# Patient Record
Sex: Female | Born: 1975 | Race: White | Hispanic: No | Marital: Married | Smoking: Former smoker
Health system: Southern US, Community
[De-identification: ages and names within clinical notes are randomized; demographics above are authoritative.]

## PROBLEM LIST (undated history)

## (undated) DIAGNOSIS — K219 Gastro-esophageal reflux disease without esophagitis: Secondary | ICD-10-CM

## (undated) DIAGNOSIS — T7840XA Allergy, unspecified, initial encounter: Secondary | ICD-10-CM

## (undated) HISTORY — DX: Allergy, unspecified, initial encounter: T78.40XA

---

## 2018-04-20 ENCOUNTER — Encounter: Payer: Self-pay | Admitting: Family Medicine

## 2018-04-20 ENCOUNTER — Ambulatory Visit (INDEPENDENT_AMBULATORY_CARE_PROVIDER_SITE_OTHER): Payer: BLUE CROSS/BLUE SHIELD | Admitting: Family Medicine

## 2018-04-20 VITALS — BP 115/79 | HR 83 | Temp 98.3°F | Ht 65.2 in | Wt 204.2 lb

## 2018-04-20 DIAGNOSIS — M79672 Pain in left foot: Secondary | ICD-10-CM | POA: Diagnosis not present

## 2018-04-20 MED ORDER — PREDNISONE 50 MG PO TABS: 50.0000 mg | ORAL_TABLET | Freq: Every day | ORAL | 0 refills | Status: DC

## 2018-04-20 NOTE — Patient Instructions (Signed)

## 2018-04-20 NOTE — Progress Notes (Signed)
BP 115/79 (BP Location: Left Arm, Patient Position: Sitting, Cuff Size: Large)   Pulse 83   Temp 98.3 F (36.8 C)   Ht 5' 5.2" (1.656 m)   Wt 204 lb 4 oz (92.6 kg)   LMP 03/21/2018 (Approximate)   SpO2 98%   BMI 33.78 kg/m    Subjective:    Patient ID: Candace Reed, female    DOB: May 03, 1976, 42 y.o.   MRN: [id number]  HPI: Candace Reed is a 42 y.o. female who presents today to establish care  Chief Complaint  Patient presents with  . Establish Care  . Foot Pain    left, difficulity walking   FOOT PAIN- pain in her arch, has been going on for years, but has gotten worse in the past few months. Just getting worse.  Duration: months Involved foot: left Mechanism of injury: unknown Location: middle of the arch- inside of her foot Onset: gradual  Severity: moderate  Quality:  Aching with occasional sharp Frequency: constant Radiation: no Aggravating factors: weight bearing and walking  Alleviating factors: ice, physical therapy, HEP, APAP, NSAIDs and rest  Status: worse Treatments attempted: rest, ice, heat, APAP, ibuprofen and aleve  Relief with NSAIDs?:  mild Weakness with weight bearing or walking: no Morning stiffness: yes Swelling: yes Redness: no Bruising: no Paresthesias / decreased sensation: no  Fevers:no   Active Ambulatory Problems    Diagnosis Date Noted  . No Active Ambulatory Problems   Resolved Ambulatory Problems    Diagnosis Date Noted  . No Resolved Ambulatory Problems   Past Medical History:  Diagnosis Date  . Allergy    History reviewed. No pertinent surgical history. Outpatient Encounter Medications as of 04/20/2018  Medication Sig  . predniSONE (DELTASONE) 50 MG tablet Take 1 tablet (50 mg total) by mouth daily with breakfast.   No facility-administered encounter medications on file as of 04/20/2018.    No Known Allergies Social History   Socioeconomic History  . Marital status: Married    Spouse name: Not on file  .  Number of children: Not on file  . Years of education: Not on file  . Highest education level: Not on file  Occupational History  . Not on file  Social Needs  . Financial resource strain: Not on file  . Food insecurity:    Worry: Not on file    Inability: Not on file  . Transportation needs:    Medical: Not on file    Non-medical: Not on file  Tobacco Use  . Smoking status: Former Smoker    Last attempt to quit: 05/19/2003    Years since quitting: 14.9  . Smokeless tobacco: Never Used  Substance and Sexual Activity  . Alcohol use: Yes    Alcohol/week: 4.0 standard drinks    Types: 2 Glasses of wine, 2 Cans of beer per week  . Drug use: Never  . Sexual activity: Yes    Birth control/protection: None  Lifestyle  . Physical activity:    Days per week: Not on file    Minutes per session: Not on file  . Stress: Not on file  Relationships  . Social connections:    Talks on phone: Not on file    Gets together: Not on file    Attends religious service: Not on file    Active member of club or organization: Not on file    Attends meetings of clubs or organizations: Not on file    Relationship status: Not on file  Other Topics Concern  . Not on file  Social History Narrative  . Not on file   Family History  Problem Relation Age of Onset  . Cancer Mother        Thyroid  . Allergies Son   . Birth defects Maternal Grandmother   . Birth defects Maternal Grandfather     Review of Systems  Constitutional: Negative.   Respiratory: Negative.   Cardiovascular: Negative.   Musculoskeletal: Positive for myalgias. Negative for arthralgias, back pain, gait problem, joint swelling, neck pain and neck stiffness.  Skin: Negative.   Neurological: Negative for dizziness, tremors, seizures, syncope, facial asymmetry, speech difficulty, weakness, light-headedness, numbness and headaches.  Hematological: Negative.   Psychiatric/Behavioral: Negative.     Per HPI unless specifically  indicated above     Objective:    BP 115/79 (BP Location: Left Arm, Patient Position: Sitting, Cuff Size: Large)   Pulse 83   Temp 98.3 F (36.8 C)   Ht 5' 5.2" (1.656 m)   Wt 204 lb 4 oz (92.6 kg)   LMP 03/21/2018 (Approximate)   SpO2 98%   BMI 33.78 kg/m   Wt Readings from Last 3 Encounters:  04/20/18 204 lb 4 oz (92.6 kg)    Physical Exam  Constitutional: She is oriented to person, place, and time. She appears well-developed and well-nourished. No distress.  HENT:  Head: Normocephalic and atraumatic.  Right Ear: Hearing normal.  Left Ear: Hearing normal.  Nose: Nose normal.  Eyes: Conjunctivae and lids are normal. Right eye exhibits no discharge. Left eye exhibits no discharge. No scleral icterus.  Cardiovascular: Normal rate, regular rhythm, normal heart sounds and intact distal pulses. Exam reveals no gallop and no friction rub.  No murmur heard. Pulmonary/Chest: Effort normal and breath sounds normal. No stridor. No respiratory distress. She has no wheezes. She has no rales. She exhibits no tenderness.  Musculoskeletal: Normal range of motion.  Tenderness over the medial arch with some swelling   Neurological: She is alert and oriented to person, place, and time.  Skin: Skin is warm, dry and intact. No rash noted. She is not diaphoretic. No erythema. No pallor.  Psychiatric: She has a normal mood and affect. Her speech is normal and behavior is normal. Judgment and thought content normal. Cognition and memory are normal.  Nursing note and vitals reviewed.   No results found for this or any previous visit.    Assessment & Plan:   Problem List Items Addressed This Visit    None    Visit Diagnoses    Left foot pain    -  Primary   Will refer to podiatry for evaluation. Treat with burst of prednisone and stretches. Call with any concerns.    Relevant Orders   Ambulatory referral to Podiatry       Follow up plan: Return As able, for Physical.

## 2018-04-25 ENCOUNTER — Ambulatory Visit: Payer: Self-pay

## 2018-04-25 ENCOUNTER — Other Ambulatory Visit: Payer: Self-pay | Admitting: Family Medicine

## 2018-04-25 NOTE — Telephone Encounter (Signed)
Patient was last seen 12//4/19 by Dr. Heidi DachJohnsonfor left foot pain and Prednisone was prescribed and referral was placed to [facility]. Specialist unable to see patient until January 7th. Patient says Prednisone helps the pain and is requesting a refill or clinical advice to hold her over until the specialist appointment.   Reason for Disposition . Caller requesting a NON-URGENT new prescription or refill and triager unable to refill per unit policy  Protocols used: MEDICATION QUESTION CALL-A-AH

## 2018-04-25 NOTE — Telephone Encounter (Signed)
It is fine for her to take naproxen until that time.

## 2018-04-25 NOTE — Telephone Encounter (Signed)
Can take 500mg  naproxen 2x a day. Cannot stay on prednisone due to side effects of long term use.

## 2018-04-25 NOTE — Telephone Encounter (Signed)
Message relayed to patient. Verbalized understanding. Patient stated [facility] cannot see her til January. Patient would like to know if it is okay if she takes naproxen until that January date.

## 2018-04-26 NOTE — Telephone Encounter (Signed)
Attempted to reach patient to relay message. Her VM box was full and could not accept any more messages.

## 2018-04-26 NOTE — Telephone Encounter (Signed)
Attempted to reach patient, no answer and VM box was full so I could not leave a VM. Will try to call again later.

## 2018-04-27 NOTE — Telephone Encounter (Signed)
Requested medication (s) are due for refill today: No  Requested medication (s) are on the active medication list: Yes  Last refill:  04/20/18  Future visit scheduled: Yes  Notes to clinic:  See request    Requested Prescriptions  Pending Prescriptions Disp Refills   predniSONE (DELTASONE) 50 MG tablet [Pharmacy Med Name: PREDNISONE 50MG  (FIFTY MG) TABLETS] 5 tablet 0    Sig: TAKE 1 TABLET(50 MG) BY MOUTH DAILY WITH BREAKFAST     Not Delegated - Endocrinology:  Oral Corticosteroids Failed - 04/25/2018 11:42 AM      Failed - This refill cannot be delegated      Passed - Last BP in normal range    BP Readings from Last 1 Encounters:  04/20/18 115/79         Passed - Valid encounter within last 6 months    Recent Outpatient Visits          1 week ago [facility] foot [facility] [place] RudMegan P, DO      Future Appointments            In 1 month Johnson, [facility] RudMegan P, [facility]

## 2018-04-27 NOTE — Telephone Encounter (Signed)
Patient notified

## 2018-05-24 ENCOUNTER — Ambulatory Visit: Payer: BLUE CROSS/BLUE SHIELD | Admitting: Podiatry

## 2018-05-24 ENCOUNTER — Ambulatory Visit (INDEPENDENT_AMBULATORY_CARE_PROVIDER_SITE_OTHER): Payer: BLUE CROSS/BLUE SHIELD

## 2018-05-24 ENCOUNTER — Other Ambulatory Visit: Payer: Self-pay | Admitting: Podiatry

## 2018-05-24 ENCOUNTER — Encounter: Payer: Self-pay | Admitting: Podiatry

## 2018-05-24 VITALS — BP 114/78 | HR 80

## 2018-05-24 DIAGNOSIS — Q742 Other congenital malformations of lower limb(s), including pelvic girdle: Secondary | ICD-10-CM

## 2018-05-24 DIAGNOSIS — M76822 Posterior tibial tendinitis, left leg: Secondary | ICD-10-CM

## 2018-05-24 DIAGNOSIS — M7752 Other enthesopathy of left foot: Secondary | ICD-10-CM

## 2018-05-24 MED ORDER — MELOXICAM 15 MG PO TABS: 15.0000 mg | ORAL_TABLET | Freq: Every day | ORAL | 1 refills | Status: AC

## 2018-05-24 MED ORDER — METHYLPREDNISOLONE 4 MG PO TBPK: ORAL_TABLET | ORAL | 0 refills | Status: DC

## 2018-05-26 NOTE — Progress Notes (Signed)
    HPI: 43 year old female presenting today as a new patient with a chief complaint of worsening intermittent sharp pain to the arch and medial dorsal aspect of the left foot that began 4-6 months ago. She states the pain began years ago while she was in high school and has been worsening lately. Walking increases the pain. She has not had any treatment for the symptoms. Patient is here for further evaluation and treatment.   Past Medical History:  Diagnosis Date  . Allergy        Physical Exam: General: The patient is alert and oriented x3 in no acute distress.  Dermatology: Skin is warm, dry and supple bilateral lower extremities. Negative for open lesions or macerations.  Vascular: Palpable pedal pulses bilaterally. No edema or erythema noted. Capillary refill within normal limits.  Neurological: Epicritic and protective threshold grossly intact bilaterally.   Musculoskeletal Exam: Pain on palpation noted to the posterior tibial tendon of the left foot. Range of motion within normal limits. Muscle strength 5/5 in all muscle groups bilateral lower extremities.  Radiographic Exam:  Accessory navicular noted. Joint spaces preserved. No fracture or dislocation identified.    Assessment: 1. 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 Patient was evaluated. Radiographs were reviewed today. 2. Injection of 0.5 mL Celestone Soluspan injected into the posterior tibial tendon sheath.  3. Prescription for Medrol Dose Pak provided to patient. 4. Prescription for Meloxicam provided to patient. 5. Discussed surgery in the future.  6. Recommended good shoe gear.  7. Return to clinic as needed for additional conservative treatment or surgical consult.   Owns a hobby farm.    Felecia ShellingBrent M. Dalynn Jhaveri, DPM [facility]  Dr. Felecia ShellingBrent M. Wilfrido Luedke, DPM    [address]                                        [place], [place] [address]                 Office [phone]  Fax [phone]

## 2018-06-10 ENCOUNTER — Other Ambulatory Visit: Payer: Self-pay

## 2018-06-10 ENCOUNTER — Encounter: Payer: Self-pay | Admitting: Family Medicine

## 2018-06-10 ENCOUNTER — Ambulatory Visit (INDEPENDENT_AMBULATORY_CARE_PROVIDER_SITE_OTHER): Payer: BLUE CROSS/BLUE SHIELD | Admitting: Family Medicine

## 2018-06-10 VITALS — BP 109/76 | HR 91 | Temp 98.3°F | Ht 65.5 in | Wt 204.0 lb

## 2018-06-10 DIAGNOSIS — Z0000 Encounter for general adult medical examination without abnormal findings: Secondary | ICD-10-CM | POA: Diagnosis not present

## 2018-06-10 DIAGNOSIS — G2581 Restless legs syndrome: Secondary | ICD-10-CM | POA: Diagnosis not present

## 2018-06-10 NOTE — Patient Instructions (Addendum)
[facility]  Address: [address], [place], [place] [address]  Phone: [phone]   Health Maintenance, Female Adopting a healthy lifestyle and getting preventive care can go a long way to promote health and wellness. Talk with your health care provider about what schedule of regular examinations is right for you. This is a good chance for you to check in with your provider about disease prevention and staying healthy. In between checkups, there are plenty of things you can do on your own. Experts have done a lot of research about which lifestyle changes and preventive measures are most likely to keep you healthy. Ask your health care provider for more information. Weight and diet Eat a healthy diet  Be sure to include plenty of vegetables, fruits, low-fat dairy products, and lean protein.  Do not eat a lot of foods high in solid fats, added sugars, or salt.  Get regular exercise. This is one of the most important things you can do for your health. ? Most adults should exercise for at least 150 minutes each week. The exercise should increase your heart rate and make you sweat (moderate-intensity exercise). ? Most adults should also do strengthening exercises at least twice a week. This is in addition to the moderate-intensity exercise. Maintain a healthy weight  Body mass index (BMI) is a measurement that can be used to identify possible weight problems. It estimates body fat based on height and weight. Your health care provider can help determine your BMI and help you achieve or maintain a healthy weight.  For females 20 years of age and older: ? A BMI below 18.5 is considered underweight. ? A BMI of 18.5 to 24.9 is normal. ? A BMI of 25 to 29.9 is considered overweight. ? A BMI of 30 and above is considered obese. Watch levels of cholesterol and blood lipids  You should start having your blood tested for lipids and cholesterol at 41 years of  age, then have this test every 5 years.  You may need to have your cholesterol levels checked more often if: ? Your lipid or cholesterol levels are high. ? You are older than 41 years of age. ? You are at high risk for heart disease. Cancer screening Lung Cancer  Lung cancer screening is recommended for adults 55-80 years old who are at high risk for lung cancer because of a history of smoking.  A yearly low-dose CT scan of the lungs is recommended for people who: ? Currently smoke. ? Have quit within the past 15 years. ? Have at least a 30-pack-year history of smoking. A pack year is smoking an average of one pack of cigarettes a day for 1 year.  Yearly screening should continue until it has been 15 years since you quit.  Yearly screening should stop if you develop a health problem that would prevent you from having lung cancer treatment. Breast Cancer  Practice breast self-awareness. This means understanding how your breasts normally appear and feel.  It also means doing regular breast self-exams. Let your health care provider know about any changes, no matter how small.  If you are in your 20s or 30s, you should have a clinical breast exam (CBE) by a health care provider every 1-3 years as part of a regular health exam.  If you are 40 or older, have a CBE every year. Also consider having a breast X-ray (mammogram) every year.  If you have a family history of   breast cancer, talk to your health care provider about genetic screening.  If you are at high risk for breast cancer, talk to your health care provider about having an MRI and a mammogram every year.  Breast cancer gene (BRCA) assessment is recommended for women who have family members with BRCA-related cancers. BRCA-related cancers include: ? Breast. ? Ovarian. ? Tubal. ? Peritoneal cancers.  Results of the assessment will determine the need for genetic counseling and BRCA1 and BRCA2 testing. Cervical Cancer Your  health care provider may recommend that you be screened regularly for cancer of the pelvic organs (ovaries, uterus, and vagina). This screening involves a pelvic examination, including checking for microscopic changes to the surface of your cervix (Pap test). You may be encouraged to have this screening done every 3 years, beginning at age 21.  For women ages 30-65, health care providers may recommend pelvic exams and Pap testing every 3 years, or they may recommend the Pap and pelvic exam, combined with testing for human papilloma virus (HPV), every 5 years. Some types of HPV increase your risk of cervical cancer. Testing for HPV may also be done on women of any age with unclear Pap test results.  Other health care providers may not recommend any screening for nonpregnant women who are considered low risk for pelvic cancer and who do not have symptoms. Ask your health care provider if a screening pelvic exam is right for you.  If you have had past treatment for cervical cancer or a condition that could lead to cancer, you need Pap tests and screening for cancer for at least 20 years after your treatment. If Pap tests have been discontinued, your risk factors (such as having a new sexual partner) need to be reassessed to determine if screening should resume. Some women have medical problems that increase the chance of getting cervical cancer. In these cases, your health care provider may recommend more frequent screening and Pap tests. Colorectal Cancer  This type of cancer can be detected and often prevented.  Routine colorectal cancer screening usually begins at 41 years of age and continues through 41 years of age.  Your health care provider may recommend screening at an earlier age if you have risk factors for colon cancer.  Your health care provider may also recommend using home test kits to check for hidden blood in the stool.  A small camera at the end of a tube can be used to examine your  colon directly (sigmoidoscopy or colonoscopy). This is done to check for the earliest forms of colorectal cancer.  Routine screening usually begins at age 50.  Direct examination of the colon should be repeated every 5-10 years through 41 years of age. However, you may need to be screened more often if early forms of precancerous polyps or small growths are found. Skin Cancer  Check your skin from head to toe regularly.  Tell your health care provider about any new moles or changes in moles, especially if there is a change in a mole's shape or color.  Also tell your health care provider if you have a mole that is larger than the size of a pencil eraser.  Always use sunscreen. Apply sunscreen liberally and repeatedly throughout the day.  Protect yourself by wearing long sleeves, pants, a wide-brimmed hat, and sunglasses whenever you are outside. Heart disease, diabetes, and high blood pressure  High blood pressure causes heart disease and increases the risk of stroke. High blood pressure is more likely   to develop in: ? People who have blood pressure in the high end of the normal range (130-139/85-89 mm Hg). ? People who are overweight or obese. ? People who are African American.  If you are 18-39 years of age, have your blood pressure checked every 3-5 years. If you are 40 years of age or older, have your blood pressure checked every year. You should have your blood pressure measured twice-once when you are at a hospital or clinic, and once when you are not at a hospital or clinic. Record the average of the two measurements. To check your blood pressure when you are not at a hospital or clinic, you can use: ? An automated blood pressure machine at a pharmacy. ? A home blood pressure monitor.  If you are between 55 years and 79 years old, ask your health care provider if you should take aspirin to prevent strokes.  Have regular diabetes screenings. This involves taking a blood sample to  check your fasting blood sugar level. ? If you are at a normal weight and have a low risk for diabetes, have this test once every three years after 41 years of age. ? If you are overweight and have a high risk for diabetes, consider being tested at a younger age or more often. Preventing infection Hepatitis B  If you have a higher risk for hepatitis B, you should be screened for this virus. You are considered at high risk for hepatitis B if: ? You were born in a country where hepatitis B is common. Ask your health care provider which countries are considered high risk. ? Your parents were born in a high-risk country, and you have not been immunized against hepatitis B (hepatitis B vaccine). ? You have HIV or AIDS. ? You use needles to inject street drugs. ? You live with someone who has hepatitis B. ? You have had sex with someone who has hepatitis B. ? You get hemodialysis treatment. ? You take certain medicines for conditions, including cancer, organ transplantation, and autoimmune conditions. Hepatitis C  Blood testing is recommended for: ? Everyone born from 1945 through 1965. ? Anyone with known risk factors for hepatitis C. Sexually transmitted infections (STIs)  You should be screened for sexually transmitted infections (STIs) including gonorrhea and chlamydia if: ? You are sexually active and are younger than 41 years of age. ? You are older than 41 years of age and your health care provider tells you that you are at risk for this type of infection. ? Your sexual activity has changed since you were last screened and you are at an increased risk for chlamydia or gonorrhea. Ask your health care provider if you are at risk.  If you do not have HIV, but are at risk, it may be recommended that you take a prescription medicine daily to prevent HIV infection. This is called pre-exposure prophylaxis (PrEP). You are considered at risk if: ? You are sexually active and do not regularly use  condoms or know the HIV status of your partner(s). ? You take drugs by injection. ? You are sexually active with a partner who has HIV. Talk with your health care provider about whether you are at high risk of being infected with HIV. If you choose to begin PrEP, you should first be tested for HIV. You should then be tested every 3 months for as long as you are taking PrEP. Pregnancy  If you are premenopausal and you may become pregnant, ask your   care provider about preconception counseling.  If you may become pregnant, take 400 to 800 micrograms (mcg) of folic acid every day.  If you want to prevent pregnancy, talk to your health care provider about birth control (contraception). Osteoporosis and menopause  Osteoporosis is a disease in which the bones lose minerals and strength with aging. This can result in serious bone fractures. Your risk for osteoporosis can be identified using a bone density scan.  If you are 53 years of age or older, or if you are at risk for osteoporosis and fractures, ask your health care provider if you should be screened.  Ask your health care provider whether you should take a calcium or vitamin D supplement to lower your risk for osteoporosis.  Menopause may have certain physical symptoms and risks.  Hormone replacement therapy may reduce some of these symptoms and risks. Talk to your health care provider about whether hormone replacement therapy is right for you. Follow these instructions at home:  Schedule regular health, dental, and eye exams.  Stay current with your immunizations.  Do not use any tobacco products including cigarettes, chewing tobacco, or electronic cigarettes.  If you are pregnant, do not drink alcohol.  If you are breastfeeding, limit how much and how often you drink alcohol.  Limit alcohol intake to no more than 1 drink per day for nonpregnant women. One drink equals 12 ounces of beer, 5 ounces of wine, or 1 ounces of  hard liquor.  Do not use street drugs.  Do not share needles.  Ask your health care provider for help if you need support or information about quitting drugs.  Tell your health care provider if you often feel depressed.  Tell your health care provider if you have ever been abused or do not feel safe at home. This information is not intended to replace advice given to you by your health care provider. Make sure you discuss any questions you have with your health care provider. Document Released: 11/17/2010 Document Revised: 10/10/2015 Document Reviewed: 02/05/2015 Elsevier Interactive Patient Education  2019 [employer] A mammogram is an X-ray of the breasts that is done to check for abnormal changes. This procedure can screen for and detect any changes that may suggest breast cancer. A mammogram can also identify other changes and variations in the breast, such as:  Inflammation of the breast tissue (mastitis).  An infected area that contains a collection of pus (abscess).  A fluid-filled sac (cyst).  Fibrocystic changes. This is when breast tissue becomes denser, which can make the tissue feel rope-like or uneven under the skin.  Tumors that are not cancerous (benign). Tell a health care provider about:  Any allergies you have.  If you have breast implants.  If you have had previous breast disease, biopsy, or surgery.  If you are breastfeeding.  Any possibility that you could be pregnant, if this applies.  If you are younger than age 37.  If you have a family history of breast cancer. What are the risks? Generally, this is a safe procedure. However, problems may occur, including:  Exposure to radiation. Radiation levels are very low with this test.  The results being misinterpreted.  The need for further tests.  The inability of the mammogram to detect certain cancers. What happens before the procedure?  Schedule your test about 1-2 weeks after  your menstrual period. This is usually when your breasts are the least tender.  If you have had a mammogram done  at a different facility in the past, get the mammogram X-rays or have them sent to your current exam facility in order to compare them.  Wash your breasts and under your arms the day of the test.  Do not wear deodorants, perfumes, lotions, or powders anywhere on your body on the day of the test.  Remove any jewelry from your neck.  Wear clothes that you can change into and out of easily. What happens during the procedure?  You will undress from the waist up and put on a gown.  You will stand in front of the X-ray machine.  Each breast will be placed between two plastic or glass plates. The plates will compress your breast for a few seconds. Try to stay as relaxed as possible during the procedure. This does not cause any harm to your breasts and any discomfort you feel will be very brief.  X-rays will be taken from different angles of each breast. The procedure may vary among health care providers and hospitals. What happens after the procedure?  The mammogram will be examined by a specialist (radiologist).  You may need to repeat certain parts of the test, depending on the quality of the images. This is commonly done if the radiologist needs a better view of the breast tissue.  Ask when your test results will be ready. Make sure you get your test results.  You may resume your normal activities. Summary  A mammogram is an X-ray of the breasts that is done to check for abnormal changes. This procedure can screen for and detect any changes that may suggest breast cancer.  If you have had a mammogram done at a different facility in the past, get the mammogram X-rays or have them sent to your current exam facility in order to compare them.  Ask when your test results will be ready. Make sure you get your test results. This information is not intended to replace advice given  to you by your health care provider. Make sure you discuss any questions you have with your health care provider. Document Released: 05/01/2000 Document Revised: 12/17/2016 Document Reviewed: 07/13/2014 Elsevier Interactive Patient Education  2019 [employer].

## 2018-06-10 NOTE — Progress Notes (Signed)
BP 109/76   Pulse 91   Temp 98.3 F (36.8 C) (Oral)   Ht 5' 5.5" (1.664 m)   Wt 204 lb (92.5 kg)   SpO2 98%   BMI 33.43 kg/m    Subjective:    Patient ID: Candace Reed, female    DOB: [birth year], 43 y.o.   MRN: [id number]  HPI: Candace Reed is a 43 y.o. female presenting on 06/10/2018 for comprehensive medical examination. Current medical complaints include:none  She currently lives with: husband and kids Menopausal Symptoms: no  Depression Screen done today and results listed below:  [facility] 06/10/2018 04/20/2018  Decreased Interest 0 0  Down, Depressed, Hopeless 0 0  PHQ - 2 Score 0 0  Altered sleeping 0 0  Tired, decreased energy 0 0  Change in appetite 0 0  Feeling bad or failure about yourself  0 0  Trouble concentrating 0 0  Moving slowly or fidgety/restless 0 0  Suicidal thoughts 0 0  PHQ-9 Score 0 0  Difficult doing work/chores Not difficult at all Not difficult at all    Past Medical History:  Past Medical History:  Diagnosis Date  . Allergy     Surgical History:  History reviewed. No pertinent surgical history.  Medications:  Current Outpatient Medications on File Prior to Visit  Medication Sig  . meloxicam (MOBIC) 15 MG tablet Take 1 tablet (15 mg total) by mouth daily.   No current facility-administered medications on file prior to visit.     Allergies:  No Known Allergies  Social History:  Social History   Socioeconomic History  . Marital status: Married    Spouse name: Not on file  . Number of children: Not on file  . Years of education: Not on file  . Highest education level: Not on file  Occupational History  . Not on file  Social Needs  . Financial resource strain: Not on file  . Food insecurity:    Worry: Not on file    Inability: Not on file  . Transportation needs:    Medical: Not on file    Non-medical: Not on file  Tobacco Use  . Smoking status: Former Smoker    Last attempt to quit: 05/19/2003   Years since quitting: 15.0  . Smokeless tobacco: Never Used  Substance and Sexual Activity  . Alcohol use: Yes    Alcohol/week: 4.0 standard drinks    Types: 2 Glasses of wine, 2 Cans of beer per week  . Drug use: Never  . Sexual activity: Yes    Birth control/protection: None  Lifestyle  . Physical activity:    Days per week: Not on file    Minutes per session: Not on file  . Stress: Not on file  Relationships  . Social connections:    Talks on phone: Not on file    Gets together: Not on file    Attends religious service: Not on file    Active member of club or organization: Not on file    Attends meetings of clubs or organizations: Not on file    Relationship status: Not on file  . Intimate partner violence:    Fear of current or ex partner: Not on file    Emotionally abused: Not on file    Physically abused: Not on file    Forced sexual activity: Not on file  Other Topics Concern  . Not on file  Social History Narrative  . Not on file  Social History   Tobacco Use  Smoking Status Former Smoker  . Last attempt to quit: 05/19/2003  . Years since quitting: 15.0  Smokeless Tobacco Never Used   Social History   Substance and Sexual Activity  Alcohol Use Yes  . Alcohol/week: 4.0 standard drinks  . Types: 2 Glasses of wine, 2 Cans of beer per week    Family History:  Family History  Problem Relation Age of Onset  . Cancer Mother        Thyroid  . Allergies Son   . Birth defects Maternal Grandmother   . Birth defects Maternal Grandfather     Past medical history, surgical history, medications, allergies, family history and social history reviewed with patient today and changes made to appropriate areas of the chart.   Review of Systems  Constitutional: Negative.   HENT: Negative.   Eyes: Negative.   Respiratory: Negative.   Cardiovascular: Negative.   Gastrointestinal: Negative.   Genitourinary: Negative.   Musculoskeletal: Negative for back pain, falls,  joint pain, myalgias and neck pain.       Foot pain   Skin: Negative.   Neurological: Negative.        Creeping feeling in her legs at night  Endo/Heme/Allergies: Positive for environmental allergies. Negative for polydipsia. Does not bruise/bleed easily.  Psychiatric/Behavioral: Negative.     All other ROS negative except what is listed above and in the HPI.      Objective:    BP 109/76   Pulse 91   Temp 98.3 F (36.8 C) (Oral)   Ht 5' 5.5" (1.664 m)   Wt 204 lb (92.5 kg)   SpO2 98%   BMI 33.43 kg/m   Wt Readings from Last 3 Encounters:  06/10/18 204 lb (92.5 kg)  04/20/18 204 lb 4 oz (92.6 kg)    Physical Exam Vitals signs and nursing note reviewed.  Constitutional:      General: She is not in acute distress.    Appearance: Normal appearance. She is not ill-appearing, toxic-appearing or diaphoretic.  HENT:     Head: Normocephalic and atraumatic.     Right Ear: Tympanic membrane, ear canal and external ear normal. There is no impacted cerumen.     Left Ear: Tympanic membrane, ear canal and external ear normal. There is no impacted cerumen.     Nose: Nose normal. No congestion or rhinorrhea.     Mouth/Throat:     Mouth: Mucous membranes are moist.     Pharynx: Oropharynx is clear. No oropharyngeal exudate or posterior oropharyngeal erythema.  Eyes:     General: No scleral icterus.       Right eye: No discharge.        Left eye: No discharge.     Extraocular Movements: Extraocular movements intact.     Conjunctiva/sclera: Conjunctivae normal.     Pupils: Pupils are equal, round, and reactive to light.  Neck:     Musculoskeletal: Normal range of motion and neck supple. No neck rigidity or muscular tenderness.     Vascular: No carotid bruit.  Cardiovascular:     Rate and Rhythm: Normal rate and regular rhythm.     Pulses: Normal pulses.     Heart sounds: No murmur. No friction rub. No gallop.   Pulmonary:     Effort: Pulmonary effort is normal. No respiratory  distress.     Breath sounds: Normal breath sounds. No stridor. No wheezing, rhonchi or rales.  Chest:     Chest  wall: No tenderness.  Abdominal:     General: Abdomen is flat. Bowel sounds are normal. There is no distension.     Palpations: Abdomen is soft. There is no mass.     Tenderness: There is no abdominal tenderness. There is no right CVA tenderness, left CVA tenderness, guarding or rebound.     Hernia: No hernia is present.  Genitourinary:    Comments: Breast and pelvic exams deferred with shared decision making Musculoskeletal:        General: No swelling, tenderness, deformity or signs of injury.     Right lower leg: No edema.     Left lower leg: No edema.  Lymphadenopathy:     Cervical: No cervical adenopathy.  Skin:    General: Skin is warm and dry.     Capillary Refill: Capillary refill takes less than 2 seconds.     Coloration: Skin is not jaundiced or pale.     Findings: No bruising, erythema, lesion or rash.  Neurological:     General: No focal deficit present.     Mental Status: She is alert and oriented to person, place, and time. Mental status is at baseline.     Cranial Nerves: No cranial nerve deficit.     Sensory: No sensory deficit.     Motor: No weakness.     Coordination: Coordination normal.     Gait: Gait normal.     Deep Tendon Reflexes: Reflexes normal.  Psychiatric:        Mood and Affect: Mood normal.        Behavior: Behavior normal.        Thought Content: Thought content normal.        Judgment: Judgment normal.     No results found for this or any previous visit.    Assessment & Plan:   Problem List Items Addressed This Visit    None    Visit Diagnoses    Routine general medical examination at a health care facility    -  Primary   Vaccines up to date. Screening labs checked today. Pap deferred- on menses. Will consider mammogram. Call with any concerns. Continue to monitor.    Relevant Orders   CBC with Differential/Platelet    Comprehensive metabolic panel   Lipid Panel w/o Chol/HDL Ratio   TSH   UA/M w/rflx Culture, Routine   VITAMIN D 25 Hydroxy (Vit-D Deficiency, Fractures)   Ferritin   Restless leg       Will check blood work. Await results. Call with any concerns.    Relevant Orders   TSH   VITAMIN D 25 Hydroxy (Vit-D Deficiency, Fractures)   Ferritin       Follow up plan: Return couple of months, for Pap.   LABORATORY TESTING:  - Pap smear: on menses today  IMMUNIZATIONS:   - Tdap: Tetanus vaccination status reviewed: last tetanus booster within 10 years. - Influenza: Refused - Pneumovax: Refused  SCREENING: -Mammogram: will consider   PATIENT COUNSELING:   Advised to take 1 mg of folate supplement per day if capable of pregnancy.   Sexuality: Discussed sexually transmitted diseases, partner selection, use of condoms, avoidance of unintended pregnancy  and contraceptive alternatives.   Advised to avoid cigarette smoking.  I discussed with the patient that most people either abstain from alcohol or drink within safe limits (<=14/week and <=4 drinks/occasion for males, <=7/weeks and <= 3 drinks/occasion for females) and that the risk for alcohol disorders and other health effects  rises proportionally with the number of drinks per week and how often a drinker exceeds daily limits.  Discussed cessation/primary prevention of drug use and availability of treatment for abuse.   Diet: Encouraged to adjust caloric intake to maintain  or achieve ideal body weight, to reduce intake of dietary saturated fat and total fat, to limit sodium intake by avoiding high sodium foods and not adding table salt, and to maintain adequate dietary potassium and calcium preferably from fresh fruits, vegetables, and low-fat dairy products.    stressed the importance of regular exercise  Injury prevention: Discussed safety belts, safety helmets, smoke detector, smoking near bedding or upholstery.   Dental health:  Discussed importance of regular tooth brushing, flossing, and dental visits.    NEXT PREVENTATIVE PHYSICAL DUE IN 1 YEAR. Return couple of months, for Pap.

## 2018-06-11 LAB — COMPREHENSIVE METABOLIC PANEL
ALT: 13 IU/L (ref 0–32)
AST: 13 IU/L (ref 0–40)
Albumin/Globulin Ratio: 1.4 (ref 1.2–2.2)
Albumin: 4.2 g/dL (ref 3.8–4.8)
Alkaline Phosphatase: 54 IU/L (ref 39–117)
BILIRUBIN TOTAL: 0.6 mg/dL (ref 0.0–1.2)
BUN/Creatinine Ratio: 15 (ref 9–23)
BUN: 15 mg/dL (ref 6–24)
CO2: 21 mmol/L (ref 20–29)
Calcium: 9.7 mg/dL (ref 8.7–10.2)
Chloride: 102 mmol/L (ref 96–106)
Creatinine, Ser: 0.97 mg/dL (ref 0.57–1.00)
GFR calc Af Amer: 84 mL/min/{1.73_m2} (ref >59)
GFR calc non Af Amer: 73 mL/min/{1.73_m2} (ref >59)
GLOBULIN, TOTAL: 2.9 g/dL (ref 1.5–4.5)
Glucose: 92 mg/dL (ref 65–99)
POTASSIUM: 4.7 mmol/L (ref 3.5–5.2)
SODIUM: 138 mmol/L (ref 134–144)
Total Protein: 7.1 g/dL (ref 6.0–8.5)

## 2018-06-11 LAB — CBC WITH DIFFERENTIAL/PLATELET
Basophils Absolute: 0 10*3/uL (ref 0.0–0.2)
Basos: 1 %
EOS (ABSOLUTE): 0.1 10*3/uL (ref 0.0–0.4)
Eos: 3 %
Hematocrit: 39.8 % (ref 34.0–46.6)
Hemoglobin: 12.9 g/dL (ref 11.1–15.9)
Immature Grans (Abs): 0 10*3/uL (ref 0.0–0.1)
Immature Granulocytes: 0 %
Lymphocytes Absolute: 1.5 10*3/uL (ref 0.7–3.1)
Lymphs: 31 %
MCH: 27.9 pg (ref 26.6–33.0)
MCHC: 32.4 g/dL (ref 31.5–35.7)
MCV: 86 fL (ref 79–97)
Monocytes Absolute: 0.4 10*3/uL (ref 0.1–0.9)
Monocytes: 9 %
NEUTROS ABS: 2.8 10*3/uL (ref 1.4–7.0)
NEUTROS PCT: 56 %
Platelets: 386 10*3/uL (ref 150–450)
RBC: 4.63 x10E6/uL (ref 3.77–5.28)
RDW: 13.3 % (ref 11.7–15.4)
WBC: 4.9 10*3/uL (ref 3.4–10.8)

## 2018-06-11 LAB — FERRITIN: Ferritin: 25 ng/mL (ref 15–150)

## 2018-06-11 LAB — LIPID PANEL W/O CHOL/HDL RATIO
Cholesterol, Total: 181 mg/dL (ref 100–199)
HDL: 72 mg/dL (ref >39)
LDL Calculated: 86 mg/dL (ref 0–99)
Triglycerides: 115 mg/dL (ref 0–149)
VLDL Cholesterol Cal: 23 mg/dL (ref 5–40)

## 2018-06-11 LAB — TSH: TSH: 1.28 u[IU]/mL (ref 0.450–4.500)

## 2018-06-11 LAB — VITAMIN D 25 HYDROXY (VIT D DEFICIENCY, FRACTURES): VIT D 25 HYDROXY: 23.6 ng/mL — AB (ref 30.0–100.0)

## 2018-06-13 ENCOUNTER — Encounter: Payer: Self-pay | Admitting: Family Medicine

## 2018-06-14 LAB — UA/M W/RFLX CULTURE, ROUTINE
Bilirubin, UA: NEGATIVE
Glucose, UA: NEGATIVE
Ketones, UA: NEGATIVE
Leukocytes, UA: NEGATIVE
Nitrite, UA: NEGATIVE
Protein, UA: NEGATIVE
Specific Gravity, UA: >1.03 — ABNORMAL HIGH (ref 1.005–1.030)
Urobilinogen, Ur: 0.2 mg/dL (ref 0.2–1.0)
pH, UA: 5 (ref 5.0–7.5)

## 2018-06-14 LAB — MICROSCOPIC EXAMINATION: RBC MICROSCOPIC, UA: NONE SEEN /HPF (ref 0–2)

## 2018-06-14 LAB — URINE CULTURE, REFLEX

## 2018-07-04 ENCOUNTER — Ambulatory Visit: Payer: BLUE CROSS/BLUE SHIELD | Admitting: Family Medicine

## 2018-07-04 ENCOUNTER — Encounter: Payer: Self-pay | Admitting: Family Medicine

## 2018-07-04 VITALS — BP 119/82 | HR 95 | Temp 98.6°F | Ht 65.5 in | Wt 202.0 lb

## 2018-07-04 DIAGNOSIS — J020 Streptococcal pharyngitis: Secondary | ICD-10-CM

## 2018-07-04 LAB — RAPID STREP SCREEN (MED CTR MEBANE ONLY): STREP GP A AG, IA W/REFLEX: POSITIVE — AB

## 2018-07-04 MED ORDER — AMOXICILLIN-POT CLAVULANATE 875-125 MG PO TABS: 1.0000 | ORAL_TABLET | Freq: Two times a day (BID) | ORAL | 0 refills | Status: DC

## 2018-07-04 NOTE — Progress Notes (Signed)
BP 119/82 (BP Location: Right Arm, Patient Position: Sitting, Cuff Size: Normal)   Pulse 95   Temp 98.6 F (37 C)   Ht 5' 5.5" (1.664 m)   Wt 202 lb (91.6 kg)   SpO2 99%   BMI 33.10 kg/m    Subjective:    Patient ID: Candace Reed, female    DOB: 22-Jan-1976, 43 y.o.   MRN: [id number]  HPI: Scott Mallicoat is a 43 y.o. female  Chief Complaint  Patient presents with  . Sore Throat    x 2 days, bodyaches and fever   Here today with 2 days of severe sore throat, body aches, fever, anorexia. Denies congestion, cough, wheezing, CP, SOB. Husband recently with strep. Taking ibuprofen and throat drops with mild temporary relief.   Relevant past medical, surgical, family and social history reviewed and updated as indicated. Interim medical history since our last visit reviewed. Allergies and medications reviewed and updated.  Review of Systems  Per HPI unless specifically indicated above     Objective:    BP 119/82 (BP Location: Right Arm, Patient Position: Sitting, Cuff Size: Normal)   Pulse 95   Temp 98.6 F (37 C)   Ht 5' 5.5" (1.664 m)   Wt 202 lb (91.6 kg)   SpO2 99%   BMI 33.10 kg/m   Wt Readings from Last 3 Encounters:  07/04/18 202 lb (91.6 kg)  06/10/18 204 lb (92.5 kg)  04/20/18 204 lb 4 oz (92.6 kg)    Physical Exam Vitals signs and nursing note reviewed.  Constitutional:      Appearance: Normal appearance. She is not ill-appearing.  HENT:     Head: Atraumatic.     Right Ear: Tympanic membrane normal.     Left Ear: Tympanic membrane normal.     Nose: Nose normal.     Mouth/Throat:     Mouth: Mucous membranes are moist.     Pharynx: Oropharyngeal exudate and posterior oropharyngeal erythema present.  Eyes:     Extraocular Movements: Extraocular movements intact.     Conjunctiva/sclera: Conjunctivae normal.  Neck:     Musculoskeletal: Normal range of motion and neck supple.  Cardiovascular:     Rate and Rhythm: Normal rate and regular rhythm.   Heart sounds: Normal heart sounds.  Pulmonary:     Effort: Pulmonary effort is normal.     Breath sounds: Normal breath sounds.  Musculoskeletal: Normal range of motion.  Lymphadenopathy:     Cervical: Cervical adenopathy present.  Skin:    General: Skin is warm and dry.  Neurological:     Mental Status: She is alert and oriented to person, place, and time.  Psychiatric:        Mood and Affect: Mood normal.        Thought Content: Thought content normal.        Judgment: Judgment normal.     Results for orders placed or performed in visit on 07/04/18  Rapid Strep Screen ([facility] ONLY)  Result Value Ref Range   Strep Gp A Ag, IA W/Reflex Positive (A) Negative      Assessment & Plan:   Problem List Items Addressed This Visit    None    Visit Diagnoses    Strep pharyngitis    -  Primary   Rapid strep pos, tx with augmentin, ibuprofen, throat drops, hot tea. F/u if not improving   Relevant Orders   Rapid Strep Screen ([facility] ONLY) (Completed)  Follow up plan: Return if symptoms worsen or fail to improve.

## 2018-08-10 ENCOUNTER — Ambulatory Visit: Payer: Self-pay | Admitting: Family Medicine

## 2018-09-02 ENCOUNTER — Ambulatory Visit (INDEPENDENT_AMBULATORY_CARE_PROVIDER_SITE_OTHER): Payer: BLUE CROSS/BLUE SHIELD | Admitting: Family Medicine

## 2018-09-02 ENCOUNTER — Encounter: Payer: Self-pay | Admitting: Family Medicine

## 2018-09-02 ENCOUNTER — Other Ambulatory Visit: Payer: Self-pay

## 2018-09-02 DIAGNOSIS — J3089 Other allergic rhinitis: Secondary | ICD-10-CM | POA: Diagnosis not present

## 2018-09-02 MED ORDER — MONTELUKAST SODIUM 10 MG PO TABS: 10.0000 mg | ORAL_TABLET | Freq: Every day | ORAL | 3 refills | Status: DC

## 2018-09-02 MED ORDER — FLUTICASONE PROPIONATE 50 MCG/ACT NA SUSP: 2.0000 | Freq: Two times a day (BID) | NASAL | 6 refills | Status: DC

## 2018-09-02 NOTE — Progress Notes (Signed)
Temp 98.5 F (36.9 C) (Temporal)   Ht 5\' 5"  (1.651 m)   Wt 197 lb (89.4 kg)   BMI 32.78 kg/m    Subjective:    Patient ID: Candace Reed, female    DOB: 04/01/1976, 43 y.o.   MRN: [id number]  HPI: Candace Reed is a 43 y.o. female  Chief Complaint  Patient presents with  . Nasal Congestion    Patient states that she's had severe allergies. Has taken Allergra, Benadryl, and Sudafed. Symptoms are worsening. Ongoing 3 weeks.  . Sinusitis    . This visit was completed via WebEx due to the restrictions of the COVID-19 pandemic. All issues as above were discussed and addressed. Physical exam was done as above through visual confirmation on WebEx. If it was felt that the patient should be evaluated in the office, they were directed there. The patient verbally consented to this visit. . Location of the patient: home . Location of the provider: home . Those involved with this call:  . Provider: Roosvelt Maser, PA-C . CMA: Myrtha Mantis, CMA . Front Desk/Registration: Harriet Pho  . Time spent on call: 15 minutes with patient face to face via video conference. More than 50% of this time was spent in counseling and coordination of care. 5 minutes total spent in review of patient's record and preparation of their chart.  I verified patient identity using two factors (patient name and date of birth). Patient consents verbally to being seen via telemedicine visit today.   Patient presenting today for 3 weeks of ongoing congestion, sinus pressure, itchy eyes, scratchy throat, occasional mild cough. Denies facial pain, fevers, chills, body aches, ear pain. Has hx of significant allergic rhinitis, currently taking allegra, benadryl, and sudafed which do help for a short time. No recent sick contacts, travel outside the country.   Relevant past medical, surgical, family and social history reviewed and updated as indicated. Interim medical history since our last visit reviewed. Allergies and  medications reviewed and updated.  Review of Systems  Per HPI unless specifically indicated above     Objective:    Temp 98.5 F (36.9 C) (Temporal)   Ht 5\' 5"  (1.651 m)   Wt 197 lb (89.4 kg)   BMI 32.78 kg/m   Wt Readings from Last 3 Encounters:  09/02/18 197 lb (89.4 kg)  07/04/18 202 lb (91.6 kg)  06/10/18 204 lb (92.5 kg)    Physical Exam Vitals signs and nursing note reviewed.  Constitutional:      General: She is not in acute distress.    Appearance: Normal appearance.  HENT:     Head: Atraumatic.     Right Ear: External ear normal.     Left Ear: External ear normal.     Nose: Congestion and rhinorrhea present.     Mouth/Throat:     Mouth: Mucous membranes are moist.     Pharynx: Oropharynx is clear. Posterior oropharyngeal erythema present.  Eyes:     Extraocular Movements: Extraocular movements intact.     Conjunctiva/sclera: Conjunctivae normal.  Neck:     Musculoskeletal: Normal range of motion.  Cardiovascular:     Comments: Unable to assess via virtual visit Pulmonary:     Effort: Pulmonary effort is normal. No respiratory distress.  Musculoskeletal: Normal range of motion.  Skin:    General: Skin is dry.     Findings: No erythema.  Neurological:     Mental Status: She is alert and oriented to person, place, and time.  Psychiatric:        Mood and Affect: Mood normal.        Thought Content: Thought content normal.        Judgment: Judgment normal.     Results for orders placed or performed in visit on 07/04/18  Rapid Strep Screen ([facility] ONLY)  Result Value Ref Range   Strep Gp A Ag, IA W/Reflex Positive (A) Negative      Assessment & Plan:   Problem List Items Addressed This Visit      Respiratory   Allergic rhinitis    Will add singulair and flonase BID to current antihistamine regimen, continue prn sudafed. Start sinus rinses, humidifier at bedtime. F/u if sxs worsening or not improving.           Follow up plan: Return  if symptoms worsen or fail to improve.

## 2018-09-06 DIAGNOSIS — J309 Allergic rhinitis, unspecified: Secondary | ICD-10-CM | POA: Insufficient documentation

## 2018-09-06 NOTE — Assessment & Plan Note (Signed)
Will add singulair and flonase BID to current antihistamine regimen, continue prn sudafed. Start sinus rinses, humidifier at bedtime. F/u if sxs worsening or not improving.

## 2018-09-19 ENCOUNTER — Other Ambulatory Visit: Payer: Self-pay

## 2018-09-19 MED ORDER — MELOXICAM 15 MG PO TABS: 15.0000 mg | ORAL_TABLET | Freq: Every day | ORAL | 3 refills | Status: DC

## 2018-09-19 NOTE — Telephone Encounter (Signed)
Pharmacy refill request for Meloxicam 15mg .  Per Dr. Logan Bores verbal order, ok to refill   Script sent to pharmacy

## 2019-01-11 ENCOUNTER — Other Ambulatory Visit: Payer: Self-pay

## 2019-01-11 ENCOUNTER — Encounter: Payer: Self-pay | Admitting: Family Medicine

## 2019-01-11 ENCOUNTER — Ambulatory Visit (INDEPENDENT_AMBULATORY_CARE_PROVIDER_SITE_OTHER): Payer: BLUE CROSS/BLUE SHIELD | Admitting: Family Medicine

## 2019-01-11 DIAGNOSIS — J069 Acute upper respiratory infection, unspecified: Secondary | ICD-10-CM

## 2019-01-11 NOTE — Progress Notes (Signed)
   There were no vitals taken for this visit.   Subjective:    Patient ID: Candace Reed, female    DOB: February 06, 1976, 43 y.o.   MRN: [id number]  HPI: Candace Reed is a 43 y.o. female  Feels bad Discussed with patient having fever chills started yesterday went away and then came back temperature up to 100.9.  No other COVID-19 symptoms of cough, no known tick exposure [place]. No other COVID-19 symptoms other than fever and generally just feels bad with headache no congestion like sinus symptoms.  Relevant past medical, surgical, family and social history reviewed and updated as indicated. Interim medical history since our last visit reviewed. Allergies and medications reviewed and updated.  Review of Systems  Constitutional: Positive for chills, diaphoresis, fatigue and fever.  Respiratory: Negative for choking and shortness of breath.   Cardiovascular: Negative.     Per HPI unless specifically indicated above     Objective:    There were no vitals taken for this visit.  Wt Readings from Last 3 Encounters:  09/02/18 197 lb (89.4 kg)  07/04/18 202 lb (91.6 kg)  06/10/18 204 lb (92.5 kg)    Physical Exam  Results for orders placed or performed in visit on 07/04/18  Rapid Strep Screen ([facility] ONLY)   Specimen: Other   OTHER  Result Value Ref Range   Strep Gp A Ag, IA W/Reflex Positive (A) Negative      Assessment & Plan:   Problem List Items Addressed This Visit      Respiratory   URI (upper respiratory infection)    Discussed URI with patient will observe for now. Discussed COVID-19 testing patient will go by the hospital discussed quarantine issues patient will appropriately quarantine. Discussed OTC medication and use.          Follow up plan: Return if symptoms worsen or fail to improve, for As scheduled.

## 2019-01-11 NOTE — Assessment & Plan Note (Signed)
Discussed URI with patient will observe for now. Discussed COVID-19 testing patient will go by the hospital discussed quarantine issues patient will appropriately quarantine. Discussed OTC medication and use.

## 2019-09-05 ENCOUNTER — Encounter: Payer: Self-pay | Admitting: Family Medicine

## 2019-09-05 ENCOUNTER — Ambulatory Visit (INDEPENDENT_AMBULATORY_CARE_PROVIDER_SITE_OTHER): Payer: 59 | Admitting: Family Medicine

## 2019-09-05 ENCOUNTER — Other Ambulatory Visit: Payer: Self-pay

## 2019-09-05 VITALS — BP 114/76 | HR 68 | Temp 98.3°F | Ht 65.16 in | Wt 211.0 lb

## 2019-09-05 DIAGNOSIS — J3089 Other allergic rhinitis: Secondary | ICD-10-CM | POA: Diagnosis not present

## 2019-09-05 DIAGNOSIS — K219 Gastro-esophageal reflux disease without esophagitis: Secondary | ICD-10-CM

## 2019-09-05 MED ORDER — OLOPATADINE HCL 0.1 % OP SOLN: 1.0000 [drp] | Freq: Two times a day (BID) | OPHTHALMIC | 12 refills | Status: DC

## 2019-09-05 MED ORDER — FLUTICASONE PROPIONATE 50 MCG/ACT NA SUSP: 2.0000 | Freq: Two times a day (BID) | NASAL | 12 refills | Status: DC

## 2019-09-05 MED ORDER — OMEPRAZOLE 20 MG PO CPDR: 20.0000 mg | DELAYED_RELEASE_CAPSULE | Freq: Every day | ORAL | 3 refills | Status: DC

## 2019-09-05 MED ORDER — MONTELUKAST SODIUM 10 MG PO TABS: 10.0000 mg | ORAL_TABLET | Freq: Every day | ORAL | 3 refills | Status: DC

## 2019-09-05 NOTE — Assessment & Plan Note (Signed)
Continue flonase and restart singulair. Pataday PRN. Call with any concerns or if not getting better.

## 2019-09-05 NOTE — Progress Notes (Signed)
BP 114/76 (BP Location: Left Arm, Patient Position: Sitting, Cuff Size: Normal)   Pulse 68   Temp 98.3 F (36.8 C) (Oral)   Ht 5' 5.16" (1.655 m)   Wt 211 lb (95.7 kg)   SpO2 99%   BMI 34.94 kg/m    Subjective:    Patient ID: Candace Reed, female    DOB: [birth year], 44 y.o.   MRN: [id number]  HPI: Candace Reed is a 44 y.o. female  Chief Complaint  Patient presents with  . Allergies    refill on singulair  . Gastroesophageal Reflux   ALLERGIES Duration: about 2 weeks Runny nose: yes  Nasal congestion: no Nasal itching: yes Sneezing: no Eye swelling, itching or discharge: yes Post nasal drip: no Cough: no Sinus pressure: no  Ear pain: no  Ear pressure: no  Fever: no  Symptoms occur seasonally: yes Symptoms occur perenially: no Satisfied with current treatment: no Allergist evaluation in past: no Allergen injection immunotherapy: no Recurrent sinus infections: no ENT evaluation in past: no Known environmental allergy: yes History of asthma: no Current allergy medications: flonase Treatments attempted: flonase  GERD- for the past few months, has been every day GERD control status: uncontrolled Satisfied with current treatment? no Heartburn frequency: daily Medication side effects: not on anything  Medication compliance: better Previous GERD medications: Tums, zantac Dysphagia: no Odynophagia:  no Hematemesis: no Blood in stool: no EGD: no  Relevant past medical, surgical, family and social history reviewed and updated as indicated. Interim medical history since our last visit reviewed. Allergies and medications reviewed and updated.  Review of Systems  Constitutional: Negative.   HENT: Positive for rhinorrhea and sneezing. Negative for congestion, dental problem, drooling, ear discharge, ear pain, facial swelling, hearing loss, mouth sores, nosebleeds, postnasal drip, sinus pressure, sinus pain, sore throat, tinnitus, trouble swallowing and voice  change.   Respiratory: Negative.   Cardiovascular: Negative.   Gastrointestinal: Positive for abdominal pain. Negative for abdominal distention, anal bleeding, blood in stool, constipation, diarrhea, nausea, rectal pain and vomiting.  Musculoskeletal: Negative.   Skin: Negative.   Psychiatric/Behavioral: Negative.     Per HPI unless specifically indicated above     Objective:    BP 114/76 (BP Location: Left Arm, Patient Position: Sitting, Cuff Size: Normal)   Pulse 68   Temp 98.3 F (36.8 C) (Oral)   Ht 5' 5.16" (1.655 m)   Wt 211 lb (95.7 kg)   SpO2 99%   BMI 34.94 kg/m   Wt Readings from Last 3 Encounters:  09/05/19 211 lb (95.7 kg)  09/02/18 197 lb (89.4 kg)  07/04/18 202 lb (91.6 kg)    Physical Exam Vitals and nursing note reviewed.  Constitutional:      General: She is not in acute distress.    Appearance: Normal appearance. She is not ill-appearing, toxic-appearing or diaphoretic.  HENT:     Head: Normocephalic and atraumatic.     Right Ear: External ear normal.     Left Ear: External ear normal.     Nose: Nose normal.     Mouth/Throat:     Mouth: Mucous membranes are moist.     Pharynx: Oropharynx is clear.  Eyes:     General: No scleral icterus.       Right eye: No discharge.        Left eye: No discharge.     Extraocular Movements: Extraocular movements intact.     Conjunctiva/sclera: Conjunctivae normal.     Pupils: Pupils are equal,  round, and reactive to light.  Cardiovascular:     Rate and Rhythm: Normal rate and regular rhythm.     Pulses: Normal pulses.     Heart sounds: Normal heart sounds. No murmur. No friction rub. No gallop.   Pulmonary:     Effort: Pulmonary effort is normal. No respiratory distress.     Breath sounds: Normal breath sounds. No stridor. No wheezing, rhonchi or rales.  Chest:     Chest wall: No tenderness.  Musculoskeletal:        General: Normal range of motion.     Cervical back: Normal range of motion and neck supple.    Skin:    General: Skin is warm and dry.     Capillary Refill: Capillary refill takes less than 2 seconds.     Coloration: Skin is not jaundiced or pale.     Findings: No bruising, erythema, lesion or rash.  Neurological:     General: No focal deficit present.     Mental Status: She is alert and oriented to person, place, and time. Mental status is at baseline.  Psychiatric:        Mood and Affect: Mood normal.        Behavior: Behavior normal.        Thought Content: Thought content normal.        Judgment: Judgment normal.     Results for orders placed or performed in visit on 07/04/18  Rapid Strep Screen ([facility] ONLY)   Specimen: Other   OTHER  Result Value Ref Range   Strep Gp A Ag, IA W/Reflex Positive (A) Negative      Assessment & Plan:   Problem List Items Addressed This Visit      Respiratory   Allergic rhinitis - Primary    Continue flonase and restart singulair. Pataday PRN. Call with any concerns or if not getting better.       Other Visit Diagnoses    Gastroesophageal reflux disease, unspecified whether esophagitis present       Will treat with 1 month of omeprazole. Call if comes back after that. Continue to monitor. Call with any concerns.    Relevant Medications   omeprazole (PRILOSEC) 20 MG capsule       Follow up plan: Return Physical.

## 2019-09-14 ENCOUNTER — Ambulatory Visit: Payer: BLUE CROSS/BLUE SHIELD | Admitting: Family Medicine

## 2019-10-02 ENCOUNTER — Encounter: Payer: Self-pay | Admitting: Family Medicine

## 2019-10-02 ENCOUNTER — Other Ambulatory Visit: Payer: Self-pay

## 2019-10-02 ENCOUNTER — Ambulatory Visit: Payer: 59 | Admitting: Family Medicine

## 2019-10-02 ENCOUNTER — Telehealth: Payer: Self-pay | Admitting: Family Medicine

## 2019-10-02 VITALS — BP 98/64 | HR 79 | Temp 98.4°F | Ht 65.0 in | Wt 209.5 lb

## 2019-10-02 DIAGNOSIS — M549 Dorsalgia, unspecified: Secondary | ICD-10-CM | POA: Diagnosis not present

## 2019-10-02 LAB — UA/M W/RFLX CULTURE, ROUTINE
Bilirubin, UA: NEGATIVE
Glucose, UA: NEGATIVE
Ketones, UA: NEGATIVE
Leukocytes,UA: NEGATIVE
Nitrite, UA: NEGATIVE
Protein,UA: NEGATIVE
Specific Gravity, UA: 1.01 (ref 1.005–1.030)
Urobilinogen, Ur: 0.2 mg/dL (ref 0.2–1.0)
pH, UA: 6 (ref 5.0–7.5)

## 2019-10-02 LAB — MICROSCOPIC EXAMINATION

## 2019-10-02 MED ORDER — CYCLOBENZAPRINE HCL 10 MG PO TABS: 10.0000 mg | ORAL_TABLET | Freq: Every day | ORAL | 1 refills | Status: DC

## 2019-10-02 MED ORDER — NAPROXEN 500 MG PO TABS: 500.0000 mg | ORAL_TABLET | Freq: Two times a day (BID) | ORAL | 1 refills | Status: DC

## 2019-10-02 MED ORDER — KETOROLAC TROMETHAMINE 60 MG/2ML IM SOLN
60.0000 mg | Freq: Once | INTRAMUSCULAR | Status: AC
  Administered 2019-10-02: 60 mg via INTRAMUSCULAR

## 2019-10-02 NOTE — Progress Notes (Signed)
BP 98/64   Pulse 79   Temp 98.4 F (36.9 C)   Ht 5\' 5"  (1.651 m)   Wt 209 lb 8 oz (95 kg)   SpO2 97%   BMI 34.86 kg/m    Subjective:    Patient ID: Candace Reed, female    DOB: [birth year], 44 y.o.   MRN: [id number]  HPI: Zenna Traister is a 44 y.o. female  Chief Complaint  Patient presents with  . Back Pain   BACK PAIN Duration: 2 days Mechanism of injury: lifting Location: midline and low back Onset: sudden Severity: severe Quality: sharp and aching Frequency: constant Radiation: none Aggravating factors: movement and bending, changing position Alleviating factors: heat, stretching, ibuprofen, Aleve Status: stable Treatments attempted: rest, heat, APAP, ibuprofen, aleve and HEP  Relief with NSAIDs?: no Nighttime pain:  no Paresthesias / decreased sensation:  no Bowel / bladder incontinence:  no Fevers:  no Dysuria / urinary frequency:  no  Relevant past medical, surgical, family and social history reviewed and updated as indicated. Interim medical history since our last visit reviewed. Allergies and medications reviewed and updated.  Review of Systems  Constitutional: Negative.   Respiratory: Negative.   Cardiovascular: Negative.   Gastrointestinal: Negative.   Musculoskeletal: Positive for back pain and myalgias. Negative for arthralgias, gait problem, joint swelling, neck pain and neck stiffness.  Neurological: Negative.   Psychiatric/Behavioral: Negative.     Per HPI unless specifically indicated above     Objective:    BP 98/64   Pulse 79   Temp 98.4 F (36.9 C)   Ht 5\' 5"  (1.651 m)   Wt 209 lb 8 oz (95 kg)   SpO2 97%   BMI 34.86 kg/m   Wt Readings from Last 3 Encounters:  10/02/19 209 lb 8 oz (95 kg)  09/05/19 211 lb (95.7 kg)  09/02/18 197 lb (89.4 kg)    Physical Exam Vitals and nursing note reviewed.  Constitutional:      General: She is not in acute distress.    Appearance: Normal appearance. She is not ill-appearing,  toxic-appearing or diaphoretic.  HENT:     Head: Normocephalic and atraumatic.     Right Ear: External ear normal.     Left Ear: External ear normal.     Nose: Nose normal.     Mouth/Throat:     Mouth: Mucous membranes are moist.     Pharynx: Oropharynx is clear.  Eyes:     General: No scleral icterus.       Right eye: No discharge.        Left eye: No discharge.     Extraocular Movements: Extraocular movements intact.     Conjunctiva/sclera: Conjunctivae normal.     Pupils: Pupils are equal, round, and reactive to light.  Cardiovascular:     Rate and Rhythm: Normal rate and regular rhythm.     Pulses: Normal pulses.     Heart sounds: Normal heart sounds. No murmur. No friction rub. No gallop.   Pulmonary:     Effort: Pulmonary effort is normal. No respiratory distress.     Breath sounds: Normal breath sounds. No stridor. No wheezing, rhonchi or rales.  Chest:     Chest wall: No tenderness.  Musculoskeletal:     Cervical back: Normal range of motion and neck supple.     Comments: Back Exam:    Inspection:  Normal spinal curvature.  No deformity, ecchymosis, erythema, or lesions     Palpation:  Midline spinal tenderness: yes lumbar    Paralumbar tenderness: yes L>R     Parathoracic tenderness: no      Buttocks tenderness: no     Range of Motion:      Flexion: Fingers to Mid-[place]     Extension:Decreased     Lateral bending:Decreased    Rotation:Decreased    Neuro Exam:Lower extremity DTRs normal & symmetric.  Strength and sensation intact.      Special Tests:      Straight leg raise:negative   Skin:    General: Skin is warm and dry.     Capillary Refill: Capillary refill takes less than 2 seconds.     Coloration: Skin is not jaundiced or pale.     Findings: No bruising, erythema, lesion or rash.  Neurological:     General: No focal deficit present.     Mental Status: She is alert and oriented to person, place, and time. Mental status is at baseline.  Psychiatric:         Mood and Affect: Mood normal.        Behavior: Behavior normal.        Thought Content: Thought content normal.        Judgment: Judgment normal.     Results for orders placed or performed in visit on 07/04/18  Rapid Strep Screen ([facility] ONLY)   Specimen: Other   OTHER  Result Value Ref Range   Strep Gp A Ag, IA W/Reflex Positive (A) Negative      Assessment & Plan:   Problem List Items Addressed This Visit    None    Visit Diagnoses    Acute back pain, unspecified back location, unspecified back pain laterality    -  Primary   Likely muscle pull. Toradol, naproxen and flexeril with stretches. Call if not getting better or getting worse. Continue to monitor. UA clear.   Relevant Medications   ketorolac (TORADOL) injection 60 mg   naproxen (NAPROSYN) 500 MG tablet   cyclobenzaprine (FLEXERIL) 10 MG tablet   Other Relevant Orders   UA/M w/rflx Culture, Routine (STAT)       Follow up plan: Return if symptoms worsen or fail to improve.

## 2019-10-02 NOTE — Patient Instructions (Signed)

## 2019-10-02 NOTE — Telephone Encounter (Signed)
Patient informed to take the flexeril tonight and naproxen tomorrow.

## 2019-10-02 NOTE — Telephone Encounter (Signed)
Copied from CRM [id number]. Topic: General - Inquiry >> Oct 02, 2019  4:15 PM Deborha Payment wrote: Reason for CRM: Patient is requesting PCP nurse to call back regarding a shot she got in office today.  Patient is wondering if she can still take her medication this evening. Call back [phone]

## 2019-10-09 ENCOUNTER — Encounter: Payer: 59 | Admitting: Family Medicine

## 2019-10-13 ENCOUNTER — Other Ambulatory Visit: Payer: Self-pay

## 2019-10-13 ENCOUNTER — Other Ambulatory Visit (HOSPITAL_COMMUNITY)
Admission: RE | Admit: 2019-10-13 | Discharge: 2019-10-13 | Disposition: A | Discharge status: Home / Self Care | Payer: 59 | Source: Ambulatory Visit | Attending: Family Medicine | Admitting: Family Medicine

## 2019-10-13 ENCOUNTER — Ambulatory Visit
Admission: RE | Admit: 2019-10-13 | Discharge: 2019-10-13 | Disposition: A | Discharge status: Home / Self Care | Payer: 59 | Source: Ambulatory Visit | Attending: Family Medicine | Admitting: Family Medicine

## 2019-10-13 ENCOUNTER — Ambulatory Visit (INDEPENDENT_AMBULATORY_CARE_PROVIDER_SITE_OTHER): Payer: 59 | Admitting: Family Medicine

## 2019-10-13 ENCOUNTER — Encounter: Payer: Self-pay | Admitting: Family Medicine

## 2019-10-13 ENCOUNTER — Ambulatory Visit
Admission: RE | Admit: 2019-10-13 | Discharge: 2019-10-13 | Disposition: A | Discharge status: Home / Self Care | Payer: 59 | Attending: Family Medicine | Admitting: Family Medicine

## 2019-10-13 VITALS — BP 108/77 | HR 88 | Temp 98.5°F | Ht 64.57 in | Wt 209.8 lb

## 2019-10-13 DIAGNOSIS — Z0000 Encounter for general adult medical examination without abnormal findings: Secondary | ICD-10-CM | POA: Diagnosis not present

## 2019-10-13 DIAGNOSIS — M549 Dorsalgia, unspecified: Secondary | ICD-10-CM

## 2019-10-13 DIAGNOSIS — Z1231 Encounter for screening mammogram for malignant neoplasm of breast: Secondary | ICD-10-CM

## 2019-10-13 DIAGNOSIS — Z124 Encounter for screening for malignant neoplasm of cervix: Secondary | ICD-10-CM | POA: Insufficient documentation

## 2019-10-13 LAB — URINALYSIS, ROUTINE W REFLEX MICROSCOPIC
Bilirubin, UA: NEGATIVE
Glucose, UA: NEGATIVE
Ketones, UA: NEGATIVE
Leukocytes,UA: NEGATIVE
Nitrite, UA: NEGATIVE
Protein,UA: NEGATIVE
RBC, UA: NEGATIVE
Specific Gravity, UA: 1.01 (ref 1.005–1.030)
Urobilinogen, Ur: 0.2 mg/dL (ref 0.2–1.0)
pH, UA: 6.5 (ref 5.0–7.5)

## 2019-10-13 MED ORDER — OMEPRAZOLE 20 MG PO CPDR: 20.0000 mg | DELAYED_RELEASE_CAPSULE | Freq: Every day | ORAL | 1 refills | Status: DC

## 2019-10-13 NOTE — Patient Instructions (Addendum)
Call to schedule your mammogram:  [facility] at [facility]  Address: [address], [place],  [address]  Phone: [phone]   Health Maintenance, Female Adopting a healthy lifestyle and getting preventive care are important in promoting health and wellness. Ask your health care provider about:  The right schedule for you to have regular tests and exams.  Things you can do on your own to prevent diseases and keep yourself healthy. What should I know about diet, weight, and exercise? Eat a healthy diet   Eat a diet that includes plenty of vegetables, fruits, low-fat dairy products, and lean protein.  Do not eat a lot of foods that are high in solid fats, added sugars, or sodium. Maintain a healthy weight Body mass index (BMI) is used to identify weight problems. It estimates body fat based on height and weight. Your health care provider can help determine your BMI and help you achieve or maintain a healthy weight. Get regular exercise Get regular exercise. This is one of the most important things you can do for your health. Most adults should:  Exercise for at least 150 minutes each week. The exercise should increase your heart rate and make you sweat (moderate-intensity exercise).  Do strengthening exercises at least twice a week. This is in addition to the moderate-intensity exercise.  Spend less time sitting. Even light physical activity can be beneficial. Watch cholesterol and blood lipids Have your blood tested for lipids and cholesterol at 42 years of age, then have this test every 5 years. Have your cholesterol levels checked more often if:  Your lipid or cholesterol levels are high.  You are older than 42 years of age.  You are at high risk for heart disease. What should I know about cancer screening? Depending on your health history and family history, you may need to have cancer screening at various ages. This may include screening  for:  Breast cancer.  Cervical cancer.  Colorectal cancer.  Skin cancer.  Lung cancer. What should I know about heart disease, diabetes, and high blood pressure? Blood pressure and heart disease  High blood pressure causes heart disease and increases the risk of stroke. This is more likely to develop in people who have high blood pressure readings, are of African descent, or are overweight.  Have your blood pressure checked: ? Every 3-5 years if you are 42-42 years of age. ? Every year if you are 42 years old or older. Diabetes Have regular diabetes screenings. This checks your fasting blood sugar level. Have the screening done:  Once every three years after age 33 if you are at a normal weight and have a low risk for diabetes.  More often and at a younger age if you are overweight or have a high risk for diabetes. What should I know about preventing infection? Hepatitis B If you have a higher risk for hepatitis B, you should be screened for this virus. Talk with your health care provider to find out if you are at risk for hepatitis B infection. Hepatitis C Testing is recommended for:  Everyone born from [birth year] through 1963.  Anyone with known risk factors for hepatitis C. Sexually transmitted infections (STIs)  Get screened for STIs, including gonorrhea and chlamydia, if: ? You are sexually active and are younger than 42 years of age. ? You are older than 42 years of age and your health care provider tells you that you are at risk for this type of  infection. ? Your sexual activity has changed since you were last screened, and you are at increased risk for chlamydia or gonorrhea. Ask your health care provider if you are at risk.  Ask your health care provider about whether you are at high risk for HIV. Your health care provider may recommend a prescription medicine to help prevent HIV infection. If you choose to take medicine to prevent HIV, you should first get tested for HIV.  You should then be tested every 3 months for as long as you are taking the medicine. Pregnancy  If you are about to stop having your period (premenopausal) and you may become pregnant, seek counseling before you get pregnant.  Take 400 to 800 micrograms (mcg) of folic acid every day if you become pregnant.  Ask for birth control (contraception) if you want to prevent pregnancy. Osteoporosis and menopause Osteoporosis is a disease in which the bones lose minerals and strength with aging. This can result in bone fractures. If you are 42 years old or older, or if you are at risk for osteoporosis and fractures, ask your health care provider if you should:  Be screened for bone loss.  Take a calcium or vitamin D supplement to lower your risk of fractures.  Be given hormone replacement therapy (HRT) to treat symptoms of menopause. Follow these instructions at home: Lifestyle  Do not use any products that contain nicotine or tobacco, such as cigarettes, e-cigarettes, and chewing tobacco. If you need help quitting, ask your health care provider.  Do not use street drugs.  Do not share needles.  Ask your health care provider for help if you need support or information about quitting drugs. Alcohol use  Do not drink alcohol if: ? Your health care provider tells you not to drink. ? You are pregnant, may be pregnant, or are planning to become pregnant.  If you drink alcohol: ? Limit how much you use to 0-1 drink a day. ? Limit intake if you are breastfeeding.  Be aware of how much alcohol is in your drink. In the [place] one drink equals one 12 oz bottle of beer (355 mL), one 5 oz glass of wine (148 mL), or one 1 oz glass of hard liquor (44 mL). General instructions  Schedule regular health, dental, and eye exams.  Stay current with your vaccines.  Tell your health care provider if: ? You often feel depressed. ? You have ever been abused or do not feel safe at  home. Summary  Adopting a healthy lifestyle and getting preventive care are important in promoting health and wellness.  Follow your health care provider's instructions about healthy diet, exercising, and getting tested or screened for diseases.  Follow your health care provider's instructions on monitoring your cholesterol and blood pressure. This information is not intended to replace advice given to you by your health care provider. Make sure you discuss any questions you have with your health care provider. Document Revised: 04/27/2018 Document Reviewed: 04/27/2018 Elsevier Patient Education  2020 [employer].  Low Back Sprain or Strain Rehab Ask your health care provider which exercises are safe for you. Do exercises exactly as told by your health care provider and adjust them as directed. It is normal to feel mild stretching, pulling, tightness, or discomfort as you do these exercises. Stop right away if you feel sudden pain or your pain gets worse. Do not begin these exercises until told by your health care provider. Stretching and range-of-motion exercises These exercises warm  up your muscles and joints and improve the movement and flexibility of your back. These exercises also help to relieve pain, numbness, and tingling. Lumbar rotation  1. Lie on your back on a firm surface and bend your knees. 2. Straighten your arms out to your sides so each arm forms a 90-degree angle (right angle) with a side of your body. 3. Slowly move (rotate) both of your knees to one side of your body until you feel a stretch in your lower back (lumbar). Try not to let your shoulders lift off the floor. 4. Hold this position for __________ seconds. 5. Tense your abdominal muscles and slowly move your knees back to the starting position. 6. Repeat this exercise on the other side of your body. Repeat __________ times. Complete this exercise __________ times a day. Single knee to chest  1. Lie on your  back on a firm surface with both legs straight. 2. Bend one of your knees. Use your hands to move your knee up toward your chest until you feel a gentle stretch in your lower back and buttock. ? Hold your leg in this position by holding on to the front of your knee. ? Keep your other leg as straight as possible. 3. Hold this position for __________ seconds. 4. Slowly return to the starting position. 5. Repeat with your other leg. Repeat __________ times. Complete this exercise __________ times a day. Prone extension on elbows  1. Lie on your abdomen on a firm surface (prone position). 2. Prop yourself up on your elbows. 3. Use your arms to help lift your chest up until you feel a gentle stretch in your abdomen and your lower back. ? This will place some of your body weight on your elbows. If this is uncomfortable, try stacking pillows under your chest. ? Your hips should stay down, against the surface that you are lying on. Keep your hip and back muscles relaxed. 4. Hold this position for __________ seconds. 5. Slowly relax your upper body and return to the starting position. Repeat __________ times. Complete this exercise __________ times a day. Strengthening exercises These exercises build strength and endurance in your back. Endurance is the ability to use your muscles for a long time, even after they get tired. Pelvic tilt This exercise strengthens the muscles that lie deep in the abdomen. 1. Lie on your back on a firm surface. Bend your knees and keep your feet flat on the floor. 2. Tense your abdominal muscles. Tip your pelvis up toward the ceiling and flatten your lower back into the floor. ? To help with this exercise, you may place a small towel under your lower back and try to push your back into the towel. 3. Hold this position for __________ seconds. 4. Let your muscles relax completely before you repeat this exercise. Repeat __________ times. Complete this exercise __________  times a day. Alternating arm and leg raises  1. Get on your hands and knees on a firm surface. If you are on a hard floor, you may want to use padding, such as an exercise mat, to cushion your knees. 2. Line up your arms and legs. Your hands should be directly below your shoulders, and your knees should be directly below your hips. 3. Lift your left leg behind you. At the same time, raise your right arm and straighten it in front of you. ? Do not lift your leg higher than your hip. ? Do not lift your arm higher than your  shoulder. ? Keep your abdominal and back muscles tight. ? Keep your hips facing the ground. ? Do not arch your back. ? Keep your balance carefully, and do not hold your breath. 4. Hold this position for __________ seconds. 5. Slowly return to the starting position. 6. Repeat with your right leg and your left arm. Repeat __________ times. Complete this exercise __________ times a day. Abdominal set with straight leg raise  1. Lie on your back on a firm surface. 2. Bend one of your knees and keep your other leg straight. 3. Tense your abdominal muscles and lift your straight leg up, 4-6 inches (10-15 cm) off the ground. 4. Keep your abdominal muscles tight and hold this position for __________ seconds. ? Do not hold your breath. ? Do not arch your back. Keep it flat against the ground. 5. Keep your abdominal muscles tense as you slowly lower your leg back to the starting position. 6. Repeat with your other leg. Repeat __________ times. Complete this exercise __________ times a day. Single leg lower with bent knees 1. Lie on your back on a firm surface. 2. Tense your abdominal muscles and lift your feet off the floor, one foot at a time, so your knees and hips are bent in 90-degree angles (right angles). ? Your knees should be over your hips and your lower legs should be parallel to the floor. 3. Keeping your abdominal muscles tense and your knee bent, slowly lower one of  your legs so your toe touches the ground. 4. Lift your leg back up to return to the starting position. ? Do not hold your breath. ? Do not let your back arch. Keep your back flat against the ground. 5. Repeat with your other leg. Repeat __________ times. Complete this exercise __________ times a day. Posture and body mechanics Good posture and healthy body mechanics can help to relieve stress in your body's tissues and joints. Body mechanics refers to the movements and positions of your body while you do your daily activities. Posture is part of body mechanics. Good posture means:  Your spine is in its natural S-curve position (neutral).  Your shoulders are pulled back slightly.  Your head is not tipped forward. Follow these guidelines to improve your posture and body mechanics in your everyday activities. Standing   When standing, keep your spine neutral and your feet about hip width apart. Keep a slight bend in your knees. Your ears, shoulders, and hips should line up.  When you do a task in which you stand in one place for a long time, place one foot up on a stable object that is 2-4 inches (5-10 cm) high, such as a footstool. This helps keep your spine neutral. Sitting   When sitting, keep your spine neutral and keep your feet flat on the floor. Use a footrest, if necessary, and keep your thighs parallel to the floor. Avoid rounding your shoulders, and avoid tilting your head forward.  When working at a desk or a computer, keep your desk at a height where your hands are slightly lower than your elbows. Slide your chair under your desk so you are close enough to maintain good posture.  When working at a computer, place your monitor at a height where you are looking straight ahead and you do not have to tilt your head forward or downward to look at the screen. Resting  When lying down and resting, avoid positions that are most painful for you.  If you  have pain with activities such  as sitting, bending, stooping, or squatting, lie in a position in which your body does not bend very much. For example, avoid curling up on your side with your arms and knees near your chest (fetal position).  If you have pain with activities such as standing for a long time or reaching with your arms, lie with your spine in a neutral position and bend your knees slightly. Try the following positions: ? Lying on your side with a pillow between your knees. ? Lying on your back with a pillow under your knees. Lifting   When lifting objects, keep your feet at least shoulder width apart and tighten your abdominal muscles.  Bend your knees and hips and keep your spine neutral. It is important to lift using the strength of your legs, not your back. Do not lock your knees straight out.  Always ask for help to lift heavy or awkward objects. This information is not intended to replace advice given to you by your health care provider. Make sure you discuss any questions you have with your health care provider. Document Revised: 08/26/2018 Document Reviewed: 05/26/2018 Elsevier Patient Education  2020 [employer].

## 2019-10-13 NOTE — Progress Notes (Signed)
BP 108/77 (BP Location: Left Arm, Patient Position: Sitting, Cuff Size: Large)   Pulse 88   Temp 98.5 F (36.9 C) (Oral)   Ht 5' 4.57" (1.64 m)   Wt 209 lb 12.8 oz (95.2 kg)   LMP 09/04/2019 (Within Weeks)   SpO2 97%   BMI 35.38 kg/m    Subjective:    Patient ID: Candace Reed, female    DOB: 06/29/1975, 44 y.o.   MRN: [id number]  HPI: Candace Reed is a 44 y.o. female presenting on 10/13/2019 for comprehensive medical examination. Current medical complaints include:  Back is not doing any better. She notes that even with doing a little bit, she starts feeling a lot worse.   She currently lives with: husband and kids Menopausal Symptoms: no  Depression Screen done today and results listed below:  Depression screen [facility] 2/9 10/13/2019 10/02/2019 06/10/2018 04/20/2018  Decreased Interest 0 0 0 0  Down, Depressed, Hopeless 0 0 0 0  PHQ - 2 Score 0 0 0 0  Altered sleeping 0 0 0 0  Tired, decreased energy 0 0 0 0  Change in appetite 0 0 0 0  Feeling bad or failure about yourself  0 0 0 0  Trouble concentrating 0 0 0 0  Moving slowly or fidgety/restless 0 0 0 0  Suicidal thoughts 0 0 0 0  PHQ-9 Score 0 0 0 0  Difficult doing work/chores Not difficult at all Not difficult at all Not difficult at all Not difficult at all    Past Medical History:  Past Medical History:  Diagnosis Date  . Allergy     Surgical History:  History reviewed. No pertinent surgical history.  Medications:  Current Outpatient Medications on File Prior to Visit  Medication Sig  . cyclobenzaprine (FLEXERIL) 10 MG tablet Take 1 tablet (10 mg total) by mouth at bedtime.  . fluticasone (FLONASE) 50 MCG/ACT nasal spray Place 2 sprays into both nostrils 2 (two) times daily.  . montelukast (SINGULAIR) 10 MG tablet Take 1 tablet (10 mg total) by mouth at bedtime.  . naproxen (NAPROSYN) 500 MG tablet Take 1 tablet (500 mg total) by mouth 2 (two) times daily with a meal.  . olopatadine (PATADAY) 0.1 %  ophthalmic solution Place 1 drop into both eyes 2 (two) times daily. (Patient not taking: Reported on 10/13/2019)   No current facility-administered medications on file prior to visit.    Allergies:  No Known Allergies  Social History:  Social History   Socioeconomic History  . Marital status: Married    Spouse name: Not on file  . Number of children: Not on file  . Years of education: Not on file  . Highest education level: Not on file  Occupational History  . Not on file  Tobacco Use  . Smoking status: Former Smoker    Quit date: 05/19/2003    Years since quitting: 16.4  . Smokeless tobacco: Never Used  Substance and Sexual Activity  . Alcohol use: Yes    Alcohol/week: 4.0 standard drinks    Types: 2 Glasses of wine, 2 Cans of beer per week  . Drug use: Never  . Sexual activity: Yes    Birth control/protection: None  Other Topics Concern  . Not on file  Social History Narrative  . Not on file   Social Determinants of Health   Financial Resource Strain:   . Difficulty of Paying Living Expenses:   Food Insecurity:   . Worried About [employer] of  Food in the Last Year:   . Ran Out of Food in the Last Year:   Transportation Needs:   . Freight forwarderLack of Transportation (Medical):   Marland Kitchen. Lack of Transportation (Non-Medical):   Physical Activity:   . Days of Exercise per Week:   . Minutes of Exercise per Session:   Stress:   . Feeling of Stress :   Social Connections:   . Frequency of Communication with Friends and Family:   . Frequency of Social Gatherings with Friends and Family:   . Attends Religious Services:   . Active Member of Clubs or Organizations:   . Attends BankerClub or Organization Meetings:   Marland Kitchen. Marital Status:   Intimate Partner Violence:   . Fear of Current or Ex-Partner:   . Emotionally Abused:   Marland Kitchen. Physically Abused:   . Sexually Abused:    Social History   Tobacco Use  Smoking Status Former Smoker  . Quit date: 05/19/2003  . Years since quitting: 16.4    Smokeless Tobacco Never Used   Social History   Substance and Sexual Activity  Alcohol Use Yes  . Alcohol/week: 4.0 standard drinks  . Types: 2 Glasses of wine, 2 Cans of beer per week    Family History:  Family History  Problem Relation Age of Onset  . Cancer Mother        Thyroid  . Allergies Son   . Birth defects Maternal Grandmother   . Birth defects Maternal Grandfather     Past medical history, surgical history, medications, allergies, family history and social history reviewed with patient today and changes made to appropriate areas of the chart.   Review of Systems  Constitutional: Negative.   HENT: Negative.   Eyes: Negative.   Respiratory: Negative.   Cardiovascular: Negative.   Gastrointestinal: Positive for constipation. Negative for abdominal pain, blood in stool, diarrhea, heartburn, melena, nausea and vomiting.  Genitourinary: Negative.   Musculoskeletal: Positive for back pain and myalgias. Negative for falls, joint pain and neck pain.  Skin: Negative.   Neurological: Negative.   Endo/Heme/Allergies: Positive for environmental allergies. Negative for polydipsia. Does not bruise/bleed easily.  Psychiatric/Behavioral: Negative.     All other ROS negative except what is listed above and in the HPI.      Objective:    BP 108/77 (BP Location: Left Arm, Patient Position: Sitting, Cuff Size: Large)   Pulse 88   Temp 98.5 F (36.9 C) (Oral)   Ht 5' 4.57" (1.64 m)   Wt 209 lb 12.8 oz (95.2 kg)   LMP 09/04/2019 (Within Weeks)   SpO2 97%   BMI 35.38 kg/m   Wt Readings from Last 3 Encounters:  10/13/19 209 lb 12.8 oz (95.2 kg)  10/02/19 209 lb 8 oz (95 kg)  09/05/19 211 lb (95.7 kg)    Physical Exam Vitals and nursing note reviewed. Exam conducted with a chaperone present.  Constitutional:      General: She is not in acute distress.    Appearance: Normal appearance. She is not ill-appearing, toxic-appearing or diaphoretic.  HENT:     Head:  Normocephalic and atraumatic.     Right Ear: Tympanic membrane, ear canal and external ear normal. There is no impacted cerumen.     Left Ear: Tympanic membrane, ear canal and external ear normal. There is no impacted cerumen.     Nose: Nose normal. No congestion or rhinorrhea.     Mouth/Throat:     Mouth: Mucous membranes are moist.  Pharynx: Oropharynx is clear. No oropharyngeal exudate or posterior oropharyngeal erythema.  Eyes:     General: No scleral icterus.       Right eye: No discharge.        Left eye: No discharge.     Extraocular Movements: Extraocular movements intact.     Conjunctiva/sclera: Conjunctivae normal.     Pupils: Pupils are equal, round, and reactive to light.  Neck:     Vascular: No carotid bruit.  Cardiovascular:     Rate and Rhythm: Normal rate and regular rhythm.     Pulses: Normal pulses.     Heart sounds: No murmur. No friction rub. No gallop.   Pulmonary:     Effort: Pulmonary effort is normal. No respiratory distress.     Breath sounds: Normal breath sounds. No stridor. No wheezing, rhonchi or rales.  Chest:     Chest wall: No tenderness.     Breasts:        Right: Normal. No swelling, bleeding, inverted nipple, mass, nipple discharge, skin change or tenderness.        Left: Normal. No swelling, bleeding, inverted nipple, mass, nipple discharge, skin change or tenderness.  Abdominal:     General: Abdomen is flat. Bowel sounds are normal. There is no distension.     Palpations: Abdomen is soft. There is no mass.     Tenderness: There is no abdominal tenderness. There is no right CVA tenderness, left CVA tenderness, guarding or rebound.     Hernia: No hernia is present. There is no hernia in the left inguinal area or right inguinal area.  Genitourinary:    Pubic Area: No rash.      Labia:        Right: No rash, tenderness, lesion or injury.        Left: No rash, tenderness, lesion or injury.      Urethra: No prolapse, urethral pain, urethral  swelling or urethral lesion.     Vagina: Vaginal discharge present.     Cervix: Normal.     Uterus: Normal.   Musculoskeletal:        General: No swelling, tenderness, deformity or signs of injury.     Cervical back: Normal range of motion and neck supple. No rigidity. No muscular tenderness.     Right lower leg: No edema.     Left lower leg: No edema.  Lymphadenopathy:     Cervical: No cervical adenopathy.     Lower Body: No right inguinal adenopathy. No left inguinal adenopathy.  Skin:    General: Skin is warm and dry.     Capillary Refill: Capillary refill takes less than 2 seconds.     Coloration: Skin is not jaundiced or pale.     Findings: No bruising, erythema, lesion or rash.  Neurological:     General: No focal deficit present.     Mental Status: She is alert and oriented to person, place, and time. Mental status is at baseline.     Cranial Nerves: No cranial nerve deficit.     Sensory: No sensory deficit.     Motor: No weakness.     Coordination: Coordination normal.     Gait: Gait normal.     Deep Tendon Reflexes: Reflexes normal.  Psychiatric:        Mood and Affect: Mood normal.        Behavior: Behavior normal.        Thought Content: Thought content normal.  Judgment: Judgment normal.     Results for orders placed or performed in visit on 10/02/19  Microscopic Examination   URINE  Result Value Ref Range   WBC, UA 0-5 0 - 5 /hpf   RBC 0-2 0 - 2 /hpf   Epithelial Cells (non renal) 0-10 0 - 10 /hpf   Bacteria, UA Few (A) None seen/Few  UA/M w/rflx Culture, Routine (STAT)   Specimen: Urine   URINE  Result Value Ref Range   Specific Gravity, UA 1.010 1.005 - 1.030   pH, UA 6.0 5.0 - 7.5   Color, UA Yellow Yellow   Appearance Ur Clear Clear   Leukocytes,UA Negative Negative   Protein,UA Negative Negative/Trace   Glucose, UA Negative Negative   Ketones, UA Negative Negative   RBC, UA Trace (A) Negative   Bilirubin, UA Negative Negative    Urobilinogen, Ur 0.2 0.2 - 1.0 mg/dL   Nitrite, UA Negative Negative   Microscopic Examination See below:       Assessment & Plan:   Problem List Items Addressed This Visit    None    Visit Diagnoses    Routine general medical examination at a health care facility    -  Primary   Vaccines up to date/declined. Screening labs checked today. Pap done. Mammogram ordered. Continue diet and exercise. Call with any concerns.    Relevant Orders   CBC with Differential/Platelet   Comprehensive metabolic panel   Lipid Panel w/o Chol/HDL Ratio   TSH   Urinalysis, Routine w reflex microscopic   Acute back pain, unspecified back location, unspecified back pain laterality       Not improving. Will obtain x-ray. If still acting up will get her in for PT.    Relevant Orders   DG Lumbar Spine Complete   Encounter for screening mammogram for malignant neoplasm of breast       Mammogram order placed today.   Relevant Orders   MM 3D SCREEN BREAST BILATERAL   Screening for cervical cancer       Pap done today.   Relevant Orders   Cytology - PAP       Follow up plan: Return in about 1 year (around 10/12/2020) for physical if not sooner.   LABORATORY TESTING:  - Pap smear: pap done  IMMUNIZATIONS:   - Tdap: Tetanus vaccination status reviewed: last tetanus booster within 10 years. - Influenza: postponed to flu season  SCREENING: -Mammogram: Ordered today   PATIENT COUNSELING:   Advised to take 1 mg of folate supplement per day if capable of pregnancy.   Sexuality: Discussed sexually transmitted diseases, partner selection, use of condoms, avoidance of unintended pregnancy  and contraceptive alternatives.   Advised to avoid cigarette smoking.  I discussed with the patient that most people either abstain from alcohol or drink within safe limits (<=14/week and <=4 drinks/occasion for males, <=7/weeks and <= 3 drinks/occasion for females) and that the risk for alcohol disorders and other  health effects rises proportionally with the number of drinks per week and how often a drinker exceeds daily limits.  Discussed cessation/primary prevention of drug use and availability of treatment for abuse.   Diet: Encouraged to adjust caloric intake to maintain  or achieve ideal body weight, to reduce intake of dietary saturated fat and total fat, to limit sodium intake by avoiding high sodium foods and not adding table salt, and to maintain adequate dietary potassium and calcium preferably from fresh fruits, vegetables, and low-fat dairy  products.    stressed the importance of regular exercise  Injury prevention: Discussed safety belts, safety helmets, smoke detector, smoking near bedding or upholstery.   Dental health: Discussed importance of regular tooth brushing, flossing, and dental visits.    NEXT PREVENTATIVE PHYSICAL DUE IN 1 YEAR. Return in about 1 year (around 10/12/2020) for physical if not sooner.

## 2019-10-14 LAB — LIPID PANEL W/O CHOL/HDL RATIO
Cholesterol, Total: 152 mg/dL (ref 100–199)
HDL: 67 mg/dL (ref >39)
LDL Chol Calc (NIH): 65 mg/dL (ref 0–99)
Triglycerides: 113 mg/dL (ref 0–149)
VLDL Cholesterol Cal: 20 mg/dL (ref 5–40)

## 2019-10-14 LAB — COMPREHENSIVE METABOLIC PANEL
ALT: 16 IU/L (ref 0–32)
AST: 17 IU/L (ref 0–40)
Albumin/Globulin Ratio: 1.5 (ref 1.2–2.2)
Albumin: 4 g/dL (ref 3.8–4.8)
Alkaline Phosphatase: 52 IU/L (ref 48–121)
BUN/Creatinine Ratio: 15 (ref 9–23)
BUN: 14 mg/dL (ref 6–24)
Bilirubin Total: 0.4 mg/dL (ref 0.0–1.2)
CO2: 23 mmol/L (ref 20–29)
Calcium: 9.1 mg/dL (ref 8.7–10.2)
Chloride: 103 mmol/L (ref 96–106)
Creatinine, Ser: 0.91 mg/dL (ref 0.57–1.00)
GFR calc Af Amer: 90 mL/min/{1.73_m2} (ref >59)
GFR calc non Af Amer: 78 mL/min/{1.73_m2} (ref >59)
Globulin, Total: 2.6 g/dL (ref 1.5–4.5)
Glucose: 90 mg/dL (ref 65–99)
Potassium: 4.5 mmol/L (ref 3.5–5.2)
Sodium: 138 mmol/L (ref 134–144)
Total Protein: 6.6 g/dL (ref 6.0–8.5)

## 2019-10-14 LAB — CBC WITH DIFFERENTIAL/PLATELET
Basophils Absolute: 0 10*3/uL (ref 0.0–0.2)
Basos: 1 %
EOS (ABSOLUTE): 0.2 10*3/uL (ref 0.0–0.4)
Eos: 3 %
Hematocrit: 35.2 % (ref 34.0–46.6)
Hemoglobin: 11.3 g/dL (ref 11.1–15.9)
Immature Grans (Abs): 0 10*3/uL (ref 0.0–0.1)
Immature Granulocytes: 0 %
Lymphocytes Absolute: 1.5 10*3/uL (ref 0.7–3.1)
Lymphs: 24 %
MCH: 28 pg (ref 26.6–33.0)
MCHC: 32.1 g/dL (ref 31.5–35.7)
MCV: 87 fL (ref 79–97)
Monocytes Absolute: 0.7 10*3/uL (ref 0.1–0.9)
Monocytes: 11 %
Neutrophils Absolute: 3.8 10*3/uL (ref 1.4–7.0)
Neutrophils: 61 %
Platelets: 325 10*3/uL (ref 150–450)
RBC: 4.03 x10E6/uL (ref 3.77–5.28)
RDW: 13.7 % (ref 11.7–15.4)
WBC: 6.2 10*3/uL (ref 3.4–10.8)

## 2019-10-14 LAB — TSH: TSH: 1.96 u[IU]/mL (ref 0.450–4.500)

## 2019-10-18 LAB — CYTOLOGY - PAP
Comment: NEGATIVE
Diagnosis: NEGATIVE
High risk HPV: NEGATIVE

## 2019-10-19 ENCOUNTER — Encounter: Payer: Self-pay | Admitting: Family Medicine

## 2019-10-24 ENCOUNTER — Other Ambulatory Visit: Payer: Self-pay

## 2019-10-24 ENCOUNTER — Ambulatory Visit
Admission: RE | Admit: 2019-10-24 | Discharge: 2019-10-24 | Disposition: A | Discharge status: Home / Self Care | Payer: 59 | Source: Ambulatory Visit | Attending: Family Medicine | Admitting: Family Medicine

## 2019-10-24 DIAGNOSIS — Z1231 Encounter for screening mammogram for malignant neoplasm of breast: Secondary | ICD-10-CM | POA: Diagnosis present

## 2020-02-08 ENCOUNTER — Encounter: Payer: Self-pay | Admitting: Family Medicine

## 2020-10-15 ENCOUNTER — Other Ambulatory Visit: Payer: Self-pay

## 2020-10-15 ENCOUNTER — Encounter: Payer: Self-pay | Admitting: Family Medicine

## 2020-10-15 ENCOUNTER — Ambulatory Visit (INDEPENDENT_AMBULATORY_CARE_PROVIDER_SITE_OTHER): Payer: 59 | Admitting: Family Medicine

## 2020-10-15 VITALS — BP 104/70 | HR 79 | Temp 98.2°F | Ht 65.0 in | Wt 207.2 lb

## 2020-10-15 DIAGNOSIS — L720 Epidermal cyst: Secondary | ICD-10-CM | POA: Diagnosis not present

## 2020-10-15 DIAGNOSIS — Z1231 Encounter for screening mammogram for malignant neoplasm of breast: Secondary | ICD-10-CM

## 2020-10-15 DIAGNOSIS — Z0000 Encounter for general adult medical examination without abnormal findings: Secondary | ICD-10-CM

## 2020-10-15 LAB — MICROSCOPIC EXAMINATION
Bacteria, UA: NONE SEEN
Epithelial Cells (non renal): NONE SEEN /hpf (ref 0–10)
WBC, UA: NONE SEEN /hpf (ref 0–5)

## 2020-10-15 LAB — URINALYSIS, ROUTINE W REFLEX MICROSCOPIC
Bilirubin, UA: NEGATIVE
Glucose, UA: NEGATIVE
Ketones, UA: NEGATIVE
Leukocytes,UA: NEGATIVE
Nitrite, UA: NEGATIVE
Protein,UA: NEGATIVE
Specific Gravity, UA: 1.02 (ref 1.005–1.030)
Urobilinogen, Ur: 0.2 mg/dL (ref 0.2–1.0)
pH, UA: 5.5 (ref 5.0–7.5)

## 2020-10-15 MED ORDER — MONTELUKAST SODIUM 10 MG PO TABS: 10.0000 mg | ORAL_TABLET | Freq: Every day | ORAL | 3 refills | Status: DC

## 2020-10-15 MED ORDER — FLUTICASONE PROPIONATE 50 MCG/ACT NA SUSP: 2.0000 | Freq: Two times a day (BID) | NASAL | 12 refills | Status: DC

## 2020-10-15 NOTE — Patient Instructions (Addendum)
Call to schedule your mammogram: [facility]  Address: [address], [place], [place] [address]  Phone: [phone]   Health Maintenance, Female Adopting a healthy lifestyle and getting preventive care are important in promoting health and wellness. Ask your health care provider about:  The right schedule for you to have regular tests and exams.  Things you can do on your own to prevent diseases and keep yourself healthy. What should I know about diet, weight, and exercise? Eat a healthy diet  Eat a diet that includes plenty of vegetables, fruits, low-fat dairy products, and lean protein.  Do not eat a lot of foods that are high in solid fats, added sugars, or sodium.   Maintain a healthy weight Body mass index (BMI) is used to identify weight problems. It estimates body fat based on height and weight. Your health care provider can help determine your BMI and help you achieve or maintain a healthy weight. Get regular exercise Get regular exercise. This is one of the most important things you can do for your health. Most adults should:  Exercise for at least 150 minutes each week. The exercise should increase your heart rate and make you sweat (moderate-intensity exercise).  Do strengthening exercises at least twice a week. This is in addition to the moderate-intensity exercise.  Spend less time sitting. Even light physical activity can be beneficial. Watch cholesterol and blood lipids Have your blood tested for lipids and cholesterol at 43 years of age, then have this test every 5 years. Have your cholesterol levels checked more often if:  Your lipid or cholesterol levels are high.  You are older than 43 years of age.  You are at high risk for heart disease. What should I know about cancer screening? Depending on your health history and family history, you may need to have cancer screening at various ages. This may include screening  for:  Breast cancer.  Cervical cancer.  Colorectal cancer.  Skin cancer.  Lung cancer. What should I know about heart disease, diabetes, and high blood pressure? Blood pressure and heart disease  High blood pressure causes heart disease and increases the risk of stroke. This is more likely to develop in people who have high blood pressure readings, are of African descent, or are overweight.  Have your blood pressure checked: ? Every 3-5 years if you are 18-39 years of age. ? Every year if you are 40 years old or older. Diabetes Have regular diabetes screenings. This checks your fasting blood sugar level. Have the screening done:  Once every three years after age 40 if you are at a normal weight and have a low risk for diabetes.  More often and at a younger age if you are overweight or have a high risk for diabetes. What should I know about preventing infection? Hepatitis B If you have a higher risk for hepatitis B, you should be screened for this virus. Talk with your health care provider to find out if you are at risk for hepatitis B infection. Hepatitis C Testing is recommended for:  Everyone born from 1945 through 1965.  Anyone with known risk factors for hepatitis C. Sexually transmitted infections (STIs)  Get screened for STIs, including gonorrhea and chlamydia, if: ? You are sexually active and are younger than 43 years of age. ? You are older than 43 years of age and your health care provider tells you that you are at risk for this type of   infection. ? Your sexual activity has changed since you were last screened, and you are at increased risk for chlamydia or gonorrhea. Ask your health care provider if you are at risk.  Ask your health care provider about whether you are at high risk for HIV. Your health care provider may recommend a prescription medicine to help prevent HIV infection. If you choose to take medicine to prevent HIV, you should first get tested for HIV.  You should then be tested every 3 months for as long as you are taking the medicine. Pregnancy  If you are about to stop having your period (premenopausal) and you may become pregnant, seek counseling before you get pregnant.  Take 400 to 800 micrograms (mcg) of folic acid every day if you become pregnant.  Ask for birth control (contraception) if you want to prevent pregnancy. Osteoporosis and menopause Osteoporosis is a disease in which the bones lose minerals and strength with aging. This can result in bone fractures. If you are 84 years old or older, or if you are at risk for osteoporosis and fractures, ask your health care provider if you should:  Be screened for bone loss.  Take a calcium or vitamin D supplement to lower your risk of fractures.  Be given hormone replacement therapy (HRT) to treat symptoms of menopause. Follow these instructions at home: Lifestyle  Do not use any products that contain nicotine or tobacco, such as cigarettes, e-cigarettes, and chewing tobacco. If you need help quitting, ask your health care provider.  Do not use street drugs.  Do not share needles.  Ask your health care provider for help if you need support or information about quitting drugs. Alcohol use  Do not drink alcohol if: ? Your health care provider tells you not to drink. ? You are pregnant, may be pregnant, or are planning to become pregnant.  If you drink alcohol: ? Limit how much you use to 0-1 drink a day. ? Limit intake if you are breastfeeding.  Be aware of how much alcohol is in your drink. In the [place] one drink equals one 12 oz bottle of beer (355 mL), one 5 oz glass of wine (148 mL), or one 1 oz glass of hard liquor (44 mL). General instructions  Schedule regular health, dental, and eye exams.  Stay current with your vaccines.  Tell your health care provider if: ? You often feel depressed. ? You have ever been abused or do not feel safe at  home. Summary  Adopting a healthy lifestyle and getting preventive care are important in promoting health and wellness.  Follow your health care provider's instructions about healthy diet, exercising, and getting tested or screened for diseases.  Follow your health care provider's instructions on monitoring your cholesterol and blood pressure. This information is not intended to replace advice given to you by your health care provider. Make sure you discuss any questions you have with your health care provider. Document Revised: 04/27/2018 Document Reviewed: 04/27/2018 Elsevier Patient Education  2021 Else[employer] Inc.  Bacterial Vaginosis  Bacterial vaginosis is an infection that occurs when the normal balance of bacteria in the vagina changes. This change is caused by an overgrowth of certain bacteria in the vagina. Bacterial vaginosis is the most common vaginal infection among females aged 42 to 64 years. This condition increases the risk of sexually transmitted infections (STIs). Treatment can help reduce this risk. Treatment is very important for pregnant women because this condition can cause babies to be born  early (prematurely) or at a low birth weight. What are the causes? This condition is caused by an increase in harmful bacteria that are normally present in small amounts in the vagina. However, the exact reason this condition develops is not known. You cannot get bacterial vaginosis from toilet seats, bedding, swimming pools, or contact with objects around you. What increases the risk? The following factors may make you more likely to develop this condition:  Having a new sexual partner or multiple sexual partners, or having unprotected sex.  Douching.  Having an intrauterine device (IUD).  Smoking.  Abusing drugs and alcohol. This may lead to riskier sexual behavior.  Taking certain antibiotic medicines.  Being pregnant. What are the signs or symptoms? Some women with  this condition have no symptoms. Symptoms may include:  Wallace CullensGray or white vaginal discharge. The discharge can be watery or foamy.  A fish-like odor with discharge, especially after sex or during menstruation.  Itching in and around the vagina.  Burning or pain with urination. How is this diagnosed? This condition is diagnosed based on:  Your medical history.  A physical exam of the vagina.  Checking a sample of vaginal fluid for harmful bacteria or abnormal cells. How is this treated? This condition is treated with antibiotic medicines. These may be given as a pill, a vaginal cream, or a medicine that is put into the vagina (suppository). If the condition comes back after treatment, a second round of antibiotics may be needed. Follow these instructions at home: Medicines  Take or apply over-the-counter and prescription medicines only as told by your health care provider.  Take or apply your antibiotic medicine as told by your health care provider. Do not stop using the antibiotic even if you start to feel better. General instructions  If you have a female sexual partner, tell her that you have a vaginal infection. She should follow up with her health care provider. If you have a female sexual partner, he does not need treatment.  Avoid sexual activity until you finish treatment.  Drink enough fluid to keep your urine pale yellow.  Keep the area around your vagina and rectum clean. ? Wash the area daily with warm water. ? Wipe yourself from front to back after using the toilet.  If you are breastfeeding, talk to your health care provider about continuing breastfeeding during treatment.  Keep all follow-up visits. This is important. How is this prevented? Self-care  Do not douche.  Wash the outside of your vagina with warm water only.  Wear cotton or cotton-lined underwear.  Avoid wearing tight pants and pantyhose, especially during the summer. Safe sex  Use protection  when having sex. This includes: ? Using condoms. ? Using dental dams. This is a thin layer of a material made of latex or polyurethane that protects the mouth during oral sex.  Limit the number of sexual partners. To help prevent bacterial vaginosis, it is best to have sex with just one partner (monogamous relationship).  Make sure you and your sexual partner are tested for STIs. Drugs and alcohol  Do not use any products that contain nicotine or tobacco. These products include cigarettes, chewing tobacco, and vaping devices, such as e-cigarettes. If you need help quitting, ask your health care provider.  Do not use drugs.  Do not drink alcohol if: ? Your health care provider tells you not to do this. ? You are pregnant, may be pregnant, or are planning to become pregnant.  If you drink  alcohol: ? Limit how much you have to 0-1 drink a day. ? Be aware of how much alcohol is in your drink. In the [place] one drink equals one 12 oz bottle of beer (355 mL), one 5 oz glass of wine (148 mL), or one 1 oz glass of hard liquor (44 mL). Where to find more information  Centers for Disease Control and Prevention: FootballExhibition.com.br  American Sexual Health Association (ASHA): www.ashastd.org  [place] Department of Health and Health and safety inspector, Office on Women's Health: http://hoffman.com/ Contact a health care provider if:  Your symptoms do not improve, even after treatment.  You have more discharge or pain when urinating.  You have a fever or chills.  You have pain in your abdomen or pelvis.  You have pain during sex.  You have vaginal bleeding between menstrual periods. Summary  Bacterial vaginosis is a vaginal infection that occurs when the normal balance of bacteria in the vagina changes. It results from an overgrowth of certain bacteria.  This condition increases the risk of sexually transmitted infections (STIs). Getting treated can help reduce this risk.  Treatment is very important for  pregnant women because this condition can cause babies to be born early (prematurely) or at low birth weight.  This condition is treated with antibiotic medicines. These may be given as a pill, a vaginal cream, or a medicine that is put into the vagina (suppository). This information is not intended to replace advice given to you by your health care provider. Make sure you discuss any questions you have with your health care provider. Document Revised: 11/02/2019 Document Reviewed: 11/02/2019 Elsevier Patient Education  2021 Elsevier Inc.  Epidermoid Cyst  An epidermoid cyst, also known as epidermal cyst, is a sac made of skin tissue. The sac contains a substance called keratin. Keratin is a protein that is normally secreted through the hair follicles. When keratin becomes trapped in the top layer of skin (epidermis), it can form an epidermoid cyst. Epidermoid cysts can be found anywhere on your body. These cysts are usually harmless (benign), and they may not cause symptoms unless they become inflamed or infected. What are the causes? This condition may be caused by:  A blocked hair follicle.  A hair that curls and re-enters the skin instead of growing straight out of the skin (ingrown hair).  A blocked pore.  Irritated skin.  An injury to the skin.  Certain conditions that are passed along from parent to child (inherited).  Human papillomavirus (HPV). This happens rarely when cysts occur on the bottom of the feet.  Long-term (chronic) sun damage to the skin. What increases the risk? The following factors may make you more likely to develop an epidermoid cyst:  Having acne.  Being female.  Having an injury to the skin.  Being past puberty.  Having certain rare genetic disorders. What are the signs or symptoms? The only symptom of this condition may be a small, painless lump underneath the skin. When an epidermal cyst ruptures, it may become inflamed. True infection in cysts  is rare. Symptoms may include:  Redness.  Inflammation.  Tenderness.  Warmth.  Keratin draining from the cyst. Keratin is grayish-white, bad-smelling substance.  Pus draining from the cyst. How is this diagnosed? This condition is diagnosed with a physical exam.  In some cases, you may have a sample of tissue (biopsy) taken from your cyst to be examined under a microscope or tested for bacteria.  You may be referred to a health care  provider who specializes in skin care (dermatologist). How is this treated? If a cyst becomes inflamed, treatment may include:  Opening and draining the cyst, done by a health care provider. After draining, minor surgery to remove the rest of the cyst may be done.  Taking antibiotic medicine.  Having injections of medicines (steroids) that help to reduce inflammation.  Having surgery to remove the cyst. Surgery may be done if the cyst: ? Becomes large. ? Bothers you. ? Has a chance of turning into cancer.  Do not try to open a cyst yourself. Follow these instructions at home: Medicines  If you were prescribed an antibiotic medicine, take it it as told by your health care provider. Do not stop using the antibiotic even if you start to feel better.  Take over-the-counter and prescription medicines only as told by your health care provider. General instructions  Keep the area around your cyst clean and dry.  Wear loose, dry clothing.  Avoid touching your cyst.  Check your cyst every day for signs of infection. Check for: ? Redness, swelling, or pain. ? Fluid or blood. ? Warmth. ? Pus or a bad smell.  Keep all follow-up visits. This is important. How is this prevented?  Wear clean, dry, clothing.  Avoid wearing tight clothing.  Keep your skin clean and dry. Take showers or baths every day. Contact a health care provider if:  Your cyst develops symptoms of infection.  Your condition is not improving or is getting worse.  You  develop a cyst that looks different from other cysts you have had.  You have a fever. Get help right away if:  Redness spreads from the cyst into the surrounding area. Summary  An epidermoid cyst is a sac made of skin tissue. These cysts are usually harmless (benign), and they may not cause symptoms unless they become inflamed.  If a cyst becomes inflamed, treatment may include surgery to open and drain the cyst, or to remove it. Treatment may also include medicines by mouth or through an injection.  Take over-the-counter and prescription medicines only as told by your health care provider. If you were prescribed an antibiotic medicine, take it as told by your health care provider. Do not stop using the antibiotic even if you start to feel better.  Contact a health care provider if your condition is not improving or is getting worse.  Keep all follow-up visits as told by your health care provider. This is important. This information is not intended to replace advice given to you by your health care provider. Make sure you discuss any questions you have with your health care provider. Document Revised: 08/09/2019 Document Reviewed: 08/09/2019 Elsevier Patient Education  2021 [employer].

## 2020-10-15 NOTE — Progress Notes (Signed)
BP 104/70   Pulse 79   Temp 98.2 F (36.8 C)   Ht 5\' 5"  (1.651 m)   Wt 207 lb 3.2 oz (94 kg)   SpO2 98%   BMI 34.48 kg/m    Subjective:    Patient ID: , female    DOB: March 24, 1976, 45 y.o.   MRN: [id number]  HPI: Candace Reed is a 45 y.o. female presenting on 10/15/2020 for comprehensive medical examination. Current medical complaints include:none  Menopausal Symptoms: no  Depression Screen done today and results listed below:  Depression screen [facility] 2/9 10/15/2020 10/13/2019 10/02/2019 06/10/2018 04/20/2018  Decreased Interest 0 0 0 0 0  Down, Depressed, Hopeless 0 0 0 0 0  PHQ - 2 Score 0 0 0 0 0  Altered sleeping - 0 0 0 0  Tired, decreased energy - 0 0 0 0  Change in appetite - 0 0 0 0  Feeling bad or failure about yourself  - 0 0 0 0  Trouble concentrating - 0 0 0 0  Moving slowly or fidgety/restless - 0 0 0 0  Suicidal thoughts - 0 0 0 0  PHQ-9 Score - 0 0 0 0  Difficult doing work/chores - Not difficult at all Not difficult at all Not difficult at all Not difficult at all    Past Medical History:  Past Medical History:  Diagnosis Date  . Allergy     Surgical History:  History reviewed. No pertinent surgical history.  Medications:  Current Outpatient Medications on File Prior to Visit  Medication Sig  . olopatadine (PATADAY) 0.1 % ophthalmic solution Place 1 drop into both eyes 2 (two) times daily. (Patient not taking: No sig reported)  . omeprazole (PRILOSEC) 20 MG capsule Take 1 capsule (20 mg total) by mouth daily. (Patient not taking: Reported on 10/15/2020)   No current facility-administered medications on file prior to visit.    Allergies:  No Known Allergies  Social History:  Social History   Socioeconomic History  . Marital status: Married    Spouse name: Not on file  . Number of children: Not on file  . Years of education: Not on file  . Highest education level: Not on file  Occupational History  . Not on file  Tobacco Use  .  Smoking status: Former Smoker    Quit date: 05/19/2003    Years since quitting: 17.4  . Smokeless tobacco: Never Used  Vaping Use  . Vaping Use: Never used  Substance and Sexual Activity  . Alcohol use: Yes    Alcohol/week: 4.0 standard drinks    Types: 2 Glasses of wine, 2 Cans of beer per week  . Drug use: Never  . Sexual activity: Yes    Birth control/protection: None  Other Topics Concern  . Not on file  Social History Narrative  . Not on file   Social Determinants of Health   Financial Resource Strain: Not on file  Food Insecurity: Not on file  Transportation Needs: Not on file  Physical Activity: Not on file  Stress: Not on file  Social Connections: Not on file  Intimate Partner Violence: Not on file   Social History   Tobacco Use  Smoking Status Former Smoker  . Quit date: 05/19/2003  . Years since quitting: 17.4  Smokeless Tobacco Never Used   Social History   Substance and Sexual Activity  Alcohol Use Yes  . Alcohol/week: 4.0 standard drinks  . Types: 2 Glasses of wine, 2 Cans  of beer per week    Family History:  Family History  Problem Relation Age of Onset  . Cancer Mother        Thyroid  . Allergies Son   . Birth defects Maternal Grandmother   . Birth defects Maternal Grandfather   . Breast cancer Neg Hx     Past medical history, surgical history, medications, allergies, family history and social history reviewed with patient today and changes made to appropriate areas of the chart.   Review of Systems  Constitutional: Negative.   HENT: Negative.   Eyes: Positive for blurred vision. Negative for double vision, photophobia, pain, discharge and redness.  Respiratory: Negative.   Cardiovascular: Negative.   Gastrointestinal: Negative.   Genitourinary: Negative.   Musculoskeletal: Negative.   Skin: Positive for itching. Negative for rash.  Neurological: Positive for tingling (L big toe). Negative for dizziness, tremors, sensory change, speech  change, focal weakness, seizures, loss of consciousness, weakness and headaches.  Endo/Heme/Allergies: Negative.   Psychiatric/Behavioral: Negative.    All other ROS negative except what is listed above and in the HPI.      Objective:    BP 104/70   Pulse 79   Temp 98.2 F (36.8 C)   Ht 5\' 5"  (1.651 m)   Wt 207 lb 3.2 oz (94 kg)   SpO2 98%   BMI 34.48 kg/m   Wt Readings from Last 3 Encounters:  10/15/20 207 lb 3.2 oz (94 kg)  10/13/19 209 lb 12.8 oz (95.2 kg)  10/02/19 209 lb 8 oz (95 kg)    Physical Exam Vitals and nursing note reviewed.  Constitutional:      General: She is not in acute distress.    Appearance: Normal appearance. She is not ill-appearing, toxic-appearing or diaphoretic.  HENT:     Head: Normocephalic and atraumatic.     Right Ear: Tympanic membrane, ear canal and external ear normal. There is no impacted cerumen.     Left Ear: Tympanic membrane, ear canal and external ear normal. There is no impacted cerumen.     Nose: Nose normal. No congestion or rhinorrhea.     Mouth/Throat:     Mouth: Mucous membranes are moist.     Pharynx: Oropharynx is clear. No oropharyngeal exudate or posterior oropharyngeal erythema.  Eyes:     General: No scleral icterus.       Right eye: No discharge.        Left eye: No discharge.     Extraocular Movements: Extraocular movements intact.     Conjunctiva/sclera: Conjunctivae normal.     Pupils: Pupils are equal, round, and reactive to light.  Neck:     Vascular: No carotid bruit.  Cardiovascular:     Rate and Rhythm: Normal rate and regular rhythm.     Pulses: Normal pulses.     Heart sounds: No murmur heard. No friction rub. No gallop.   Pulmonary:     Effort: Pulmonary effort is normal. No respiratory distress.     Breath sounds: Normal breath sounds. No stridor. No wheezing, rhonchi or rales.  Chest:     Chest wall: No tenderness.  Abdominal:     General: Abdomen is flat. Bowel sounds are normal. There is no  distension.     Palpations: Abdomen is soft. There is no mass.     Tenderness: There is no abdominal tenderness. There is no right CVA tenderness, left CVA tenderness, guarding or rebound.     Hernia: No hernia is  present.  Genitourinary:    Comments: Breast and pelvic exams deferred with shared decision making Musculoskeletal:        General: No swelling, tenderness, deformity or signs of injury.     Cervical back: Normal range of motion and neck supple. No rigidity. No muscular tenderness.     Right lower leg: No edema.     Left lower leg: No edema.  Lymphadenopathy:     Cervical: No cervical adenopathy.  Skin:    General: Skin is warm and dry.     Capillary Refill: Capillary refill takes less than 2 seconds.     Coloration: Skin is not jaundiced or pale.     Findings: No bruising, erythema, lesion or rash.  Neurological:     General: No focal deficit present.     Mental Status: She is alert and oriented to person, place, and time. Mental status is at baseline.     Cranial Nerves: No cranial nerve deficit.     Sensory: No sensory deficit.     Motor: No weakness.     Coordination: Coordination normal.     Gait: Gait normal.     Deep Tendon Reflexes: Reflexes normal.  Psychiatric:        Mood and Affect: Mood normal.        Behavior: Behavior normal.        Thought Content: Thought content normal.        Judgment: Judgment normal.     Results for orders placed or performed in visit on 10/15/20  Microscopic Examination   Urine  Result Value Ref Range   WBC, UA None seen 0 - 5 /hpf   RBC 0-2 0 - 2 /hpf   Epithelial Cells (non renal) None seen 0 - 10 /hpf   Bacteria, UA None seen None seen/Few  Urinalysis, Routine w reflex microscopic  Result Value Ref Range   Specific Gravity, UA 1.020 1.005 - 1.030   pH, UA 5.5 5.0 - 7.5   Color, UA Yellow Yellow   Appearance Ur Clear Clear   Leukocytes,UA Negative Negative   Protein,UA Negative Negative/Trace   Glucose, UA Negative  Negative   Ketones, UA Negative Negative   RBC, UA Trace (A) Negative   Bilirubin, UA Negative Negative   Urobilinogen, Ur 0.2 0.2 - 1.0 mg/dL   Nitrite, UA Negative Negative   Microscopic Examination See below:       Assessment & Plan:   Problem List Items Addressed This Visit   None   Visit Diagnoses    Routine general medical examination at a health care facility    -  Primary   Vaccines up to date/declined. Screening labs checked today. Pap up to date. Mammogram ordered. Continue diet and exercise. Call with any concerns.    Relevant Orders   CBC with Differential/Platelet   Comprehensive metabolic panel   Lipid Panel w/o Chol/HDL Ratio   Urinalysis, Routine w reflex microscopic (Completed)   TSH   HIV Antibody (routine testing w rflx)   Hepatitis C Antibody   Inclusion cyst       On lip border. Will refer to derm. Call with any concerns.    Relevant Orders   Ambulatory referral to Dermatology   Encounter for screening mammogram for malignant neoplasm of breast       Mammogram ordered.    Relevant Orders   MM DIAG BREAST TOMO BILATERAL       Follow up plan: Return in about 1 year (  around 10/15/2021) for physical.   LABORATORY TESTING:  - Pap smear: not applicable  IMMUNIZATIONS:   - Tdap: Tetanus vaccination status reviewed: last tetanus booster within 10 years. - Influenza: Postponed to flu season - Pneumovax: Not applicable - Prevnar: Not applicable - COVID: Refused  SCREENING: -Mammogram: Ordered today  - Colonoscopy: Not applicable   PATIENT COUNSELING:   Advised to take 1 mg of folate supplement per day if capable of pregnancy.   Sexuality: Discussed sexually transmitted diseases, partner selection, use of condoms, avoidance of unintended pregnancy  and contraceptive alternatives.   Advised to avoid cigarette smoking.  I discussed with the patient that most people either abstain from alcohol or drink within safe limits (<=14/week and <=4  drinks/occasion for males, <=7/weeks and <= 3 drinks/occasion for females) and that the risk for alcohol disorders and other health effects rises proportionally with the number of drinks per week and how often a drinker exceeds daily limits.  Discussed cessation/primary prevention of drug use and availability of treatment for abuse.   Diet: Encouraged to adjust caloric intake to maintain  or achieve ideal body weight, to reduce intake of dietary saturated fat and total fat, to limit sodium intake by avoiding high sodium foods and not adding table salt, and to maintain adequate dietary potassium and calcium preferably from fresh fruits, vegetables, and low-fat dairy products.    stressed the importance of regular exercise  Injury prevention: Discussed safety belts, safety helmets, smoke detector, smoking near bedding or upholstery.   Dental health: Discussed importance of regular tooth brushing, flossing, and dental visits.    NEXT PREVENTATIVE PHYSICAL DUE IN 1 YEAR. Return in about 1 year (around 10/15/2021) for physical.

## 2020-10-16 LAB — CBC WITH DIFFERENTIAL/PLATELET
Basophils Absolute: 0 10*3/uL (ref 0.0–0.2)
Basos: 0 %
EOS (ABSOLUTE): 0.2 10*3/uL (ref 0.0–0.4)
Eos: 4 %
Hematocrit: 37.5 % (ref 34.0–46.6)
Hemoglobin: 12 g/dL (ref 11.1–15.9)
Immature Grans (Abs): 0 10*3/uL (ref 0.0–0.1)
Immature Granulocytes: 0 %
Lymphocytes Absolute: 1.6 10*3/uL (ref 0.7–3.1)
Lymphs: 31 %
MCH: 28.6 pg (ref 26.6–33.0)
MCHC: 32 g/dL (ref 31.5–35.7)
MCV: 89 fL (ref 79–97)
Monocytes Absolute: 0.6 10*3/uL (ref 0.1–0.9)
Monocytes: 11 %
Neutrophils Absolute: 2.8 10*3/uL (ref 1.4–7.0)
Neutrophils: 54 %
Platelets: 391 10*3/uL (ref 150–450)
RBC: 4.2 x10E6/uL (ref 3.77–5.28)
RDW: 13.5 % (ref 11.7–15.4)
WBC: 5.2 10*3/uL (ref 3.4–10.8)

## 2020-10-16 LAB — COMPREHENSIVE METABOLIC PANEL
ALT: 12 IU/L (ref 0–32)
AST: 15 IU/L (ref 0–40)
Albumin/Globulin Ratio: 1.5 (ref 1.2–2.2)
Albumin: 4.1 g/dL (ref 3.8–4.8)
Alkaline Phosphatase: 64 IU/L (ref 44–121)
BUN/Creatinine Ratio: 15 (ref 9–23)
BUN: 13 mg/dL (ref 6–24)
Bilirubin Total: 0.4 mg/dL (ref 0.0–1.2)
CO2: 23 mmol/L (ref 20–29)
Calcium: 9.5 mg/dL (ref 8.7–10.2)
Chloride: 103 mmol/L (ref 96–106)
Creatinine, Ser: 0.84 mg/dL (ref 0.57–1.00)
Globulin, Total: 2.7 g/dL (ref 1.5–4.5)
Glucose: 87 mg/dL (ref 65–99)
Potassium: 4.5 mmol/L (ref 3.5–5.2)
Sodium: 139 mmol/L (ref 134–144)
Total Protein: 6.8 g/dL (ref 6.0–8.5)
eGFR: 88 mL/min/{1.73_m2} (ref >59)

## 2020-10-16 LAB — HIV ANTIBODY (ROUTINE TESTING W REFLEX): HIV Screen 4th Generation wRfx: NONREACTIVE

## 2020-10-16 LAB — TSH: TSH: 1.67 u[IU]/mL (ref 0.450–4.500)

## 2020-10-16 LAB — LIPID PANEL W/O CHOL/HDL RATIO
Cholesterol, Total: 168 mg/dL (ref 100–199)
HDL: 56 mg/dL (ref >39)
LDL Chol Calc (NIH): 74 mg/dL (ref 0–99)
Triglycerides: 234 mg/dL — ABNORMAL HIGH (ref 0–149)
VLDL Cholesterol Cal: 38 mg/dL (ref 5–40)

## 2020-10-16 LAB — HEPATITIS C ANTIBODY: Hep C Virus Ab: <0.1 s/co ratio (ref 0.0–0.9)

## 2021-02-18 ENCOUNTER — Encounter: Payer: Self-pay | Admitting: Family Medicine

## 2021-02-25 ENCOUNTER — Ambulatory Visit (INDEPENDENT_AMBULATORY_CARE_PROVIDER_SITE_OTHER): Payer: 59 | Admitting: Nurse Practitioner

## 2021-02-25 ENCOUNTER — Other Ambulatory Visit: Payer: Self-pay

## 2021-02-25 ENCOUNTER — Encounter: Payer: Self-pay | Admitting: Nurse Practitioner

## 2021-02-25 VITALS — BP 103/73 | HR 75 | Temp 98.3°F | Ht 65.0 in | Wt 208.2 lb

## 2021-02-25 DIAGNOSIS — J069 Acute upper respiratory infection, unspecified: Secondary | ICD-10-CM

## 2021-02-25 MED ORDER — BENZONATATE 100 MG PO CAPS: 100.0000 mg | ORAL_CAPSULE | Freq: Three times a day (TID) | ORAL | 0 refills | Status: DC | PRN

## 2021-02-25 MED ORDER — PREDNISONE 10 MG PO TABS
ORAL_TABLET | ORAL | 0 refills | Status: DC
Start: 2021-02-25 — End: 2021-04-08

## 2021-02-25 NOTE — Progress Notes (Signed)
Acute Office Visit  Subjective:    Patient ID: Candace Reed, female    DOB: 10/21/1975, 45 y.o.   MRN: [id number]  Chief Complaint  Patient presents with   Cough    Patient has had for the past 11 days.    Sore Throat    HPI Patient is in today for cough and sore throat for 11 days. Has had 2 negative covid-19 tests at home.   UPPER RESPIRATORY TRACT INFECTION  Worst symptom: cough Fever: no Cough: yes Shortness of breath: no Wheezing: no Chest pain: no Chest tightness: no Chest congestion: no Nasal congestion: no Runny nose: no Post nasal drip:  mild Sneezing: no Sore throat: yes Swollen glands: no Sinus pressure: yes Headache: yes Face pain: no Toothache: no Ear pain: no  Ear pressure: no  Eyes red/itching:no Eye drainage/crusting: no  Vomiting: no Rash: no Fatigue: yes Sick contacts: yes Strep contacts: no  Context: fluctuating Recurrent sinusitis: no Relief with OTC cold/cough medications:  mild   Treatments attempted: cold/sinus and cough syrup, herbal supplements, zycam nasal swab   Past Medical History:  Diagnosis Date   Allergy     History reviewed. No pertinent surgical history.  Family History  Problem Relation Age of Onset   Cancer Mother        Thyroid   Allergies Son    Birth defects Maternal Grandmother    Birth defects Maternal Grandfather    Breast cancer Neg Hx     Social History   Socioeconomic History   Marital status: Married    Spouse name: Not on file   Number of children: Not on file   Years of education: Not on file   Highest education level: Not on file  Occupational History   Not on file  Tobacco Use   Smoking status: Former    Types: Cigarettes    Quit date: 05/19/2003    Years since quitting: 17.7   Smokeless tobacco: Never  Vaping Use   Vaping Use: Never used  Substance and Sexual Activity   Alcohol use: Yes    Alcohol/week: 4.0 standard drinks    Types: 2 Glasses of wine, 2 Cans of beer per week    Drug use: Never   Sexual activity: Yes    Birth control/protection: None  Other Topics Concern   Not on file  Social History Narrative   Not on file   Social Determinants of Health   Financial Resource Strain: Not on file  Food Insecurity: Not on file  Transportation Needs: Not on file  Physical Activity: Not on file  Stress: Not on file  Social Connections: Not on file  Intimate Partner Violence: Not on file    Outpatient Medications Prior to Visit  Medication Sig Dispense Refill   fluticasone (FLONASE) 50 MCG/ACT nasal spray Place 2 sprays into both nostrils 2 (two) times daily. 16 g 12   montelukast (SINGULAIR) 10 MG tablet Take 1 tablet (10 mg total) by mouth at bedtime. 90 tablet 3   olopatadine (PATADAY) 0.1 % ophthalmic solution Place 1 drop into both eyes 2 (two) times daily. (Patient not taking: No sig reported) 5 mL 12   omeprazole (PRILOSEC) 20 MG capsule Take 1 capsule (20 mg total) by mouth daily. (Patient not taking: No sig reported) 90 capsule 1   No facility-administered medications prior to visit.    No Known Allergies  Review of Systems  Constitutional:  Positive for fatigue. Negative for fever.  HENT:  Positive  for congestion (resolving), sinus pressure (intermittent) and sore throat. Negative for ear pain and rhinorrhea.   Eyes: Negative.   Respiratory:  Positive for cough. Negative for shortness of breath.   Cardiovascular: Negative.   Gastrointestinal: Negative.   Genitourinary: Negative.   Musculoskeletal:  Positive for myalgias.  Skin: Negative.   Neurological:  Positive for headaches. Negative for dizziness.      Objective:    Physical Exam Vitals and nursing note reviewed.  Constitutional:      General: She is not in acute distress.    Appearance: Normal appearance.  HENT:     Head: Normocephalic.     Right Ear: Tympanic membrane, ear canal and external ear normal.     Left Ear: Tympanic membrane, ear canal and external ear normal.      Mouth/Throat:     Mouth: Mucous membranes are moist.     Pharynx: Oropharynx is clear. Posterior oropharyngeal erythema present. No oropharyngeal exudate.  Eyes:     Conjunctiva/sclera: Conjunctivae normal.  Cardiovascular:     Rate and Rhythm: Normal rate and regular rhythm.     Pulses: Normal pulses.     Heart sounds: Normal heart sounds.  Pulmonary:     Effort: Pulmonary effort is normal.     Breath sounds: Normal breath sounds.  Musculoskeletal:     Cervical back: Normal range of motion and neck supple. No tenderness.  Lymphadenopathy:     Cervical: No cervical adenopathy.  Skin:    General: Skin is warm.  Neurological:     General: No focal deficit present.     Mental Status: She is alert and oriented to person, place, and time.  Psychiatric:        Mood and Affect: Mood normal.        Behavior: Behavior normal.        Thought Content: Thought content normal.        Judgment: Judgment normal.    BP 103/73   Pulse 75   Temp 98.3 F (36.8 C) (Oral)   Ht _0  (1.651 m)   Wt 208 lb 3.2 oz (94.4 kg)   SpO2 98%   BMI 34.65 kg/m  Wt Readings from Last 3 Encounters:  02/25/21 208 lb 3.2 oz (94.4 kg)  10/15/20 207 lb 3.2 oz (94 kg)  10/13/19 209 lb 12.8 oz (95.2 kg)    Health Maintenance Due  Topic Date Due   INFLUENZA VACCINE  Never done    There are no preventive care reminders to display for this patient.   Lab Results  Component Value Date   TSH 1.670 10/15/2020   Lab Results  Component Value Date   WBC 5.2 10/15/2020   HGB 12.0 10/15/2020   HCT 37.5 10/15/2020   MCV 89 10/15/2020   PLT 391 10/15/2020   Lab Results  Component Value Date   NA 139 10/15/2020   K 4.5 10/15/2020   CO2 23 10/15/2020   GLUCOSE 87 10/15/2020   BUN 13 10/15/2020   CREATININE 0.84 10/15/2020   BILITOT 0.4 10/15/2020   ALKPHOS 64 10/15/2020   AST 15 10/15/2020   ALT 12 10/15/2020   PROT 6.8 10/15/2020   ALBUMIN 4.1 10/15/2020   CALCIUM 9.5 10/15/2020   EGFR 88  10/15/2020   Lab Results  Component Value Date   CHOL 168 10/15/2020   Lab Results  Component Value Date   HDL 56 10/15/2020   Lab Results  Component Value Date   LDLCALC 74  10/15/2020   Lab Results  Component Value Date   TRIG 234 (H) 10/15/2020   No results found for: CHOLHDL No results found for: HGBA1C     Assessment & Plan:   Problem List Items Addressed This Visit   None Visit Diagnoses     Upper respiratory tract infection, unspecified type    -  Primary   Covid-19 tests negative. No pain in sinuses on palpation. Continue OTC meds. Gargle with warm salt water prn. Will send in prednisone and tessalon        Meds ordered this encounter  Medications   predniSONE (DELTASONE) 10 MG tablet    Sig: Take 6 tablets today, then 5 tablets tomorrow, then decrease by 1 tablet every day until gone    Dispense:  21 tablet    Refill:  0   benzonatate (TESSALON) 100 MG capsule    Sig: Take 1-2 capsules (100-200 mg total) by mouth 3 (three) times daily as needed for cough.    Dispense:  30 capsule    Refill:  0     Charyl Dancer, NP

## 2021-02-25 NOTE — Patient Instructions (Signed)
Drink plenty of fluids Gargle with warm salt water  Ibuprofen as needed for sore throat Start prednisone taper for 6 days, take in the morning with food Tessalon as needed for cough 3 times a day

## 2021-03-03 NOTE — Telephone Encounter (Signed)
Can we please see about getting Candace Reed in?

## 2021-04-08 ENCOUNTER — Encounter: Payer: Self-pay | Admitting: Family Medicine

## 2021-04-08 ENCOUNTER — Other Ambulatory Visit: Payer: Self-pay

## 2021-04-08 ENCOUNTER — Ambulatory Visit: Payer: 59 | Admitting: Family Medicine

## 2021-04-08 VITALS — BP 105/71 | HR 79 | Temp 98.1°F | Wt 209.8 lb

## 2021-04-08 DIAGNOSIS — M545 Low back pain, unspecified: Secondary | ICD-10-CM

## 2021-04-08 MED ORDER — NAPROXEN 500 MG PO TABS: 500.0000 mg | ORAL_TABLET | Freq: Two times a day (BID) | ORAL | 2 refills | Status: DC

## 2021-04-08 MED ORDER — CYCLOBENZAPRINE HCL 10 MG PO TABS: 10.0000 mg | ORAL_TABLET | Freq: Every evening | ORAL | 0 refills | Status: DC | PRN

## 2021-04-08 MED ORDER — KETOROLAC TROMETHAMINE 60 MG/2ML IM SOLN
60.0000 mg | Freq: Once | INTRAMUSCULAR | Status: AC
  Administered 2021-04-08: 60 mg via INTRAMUSCULAR

## 2021-04-08 NOTE — Telephone Encounter (Signed)
Appt this PM °

## 2021-04-08 NOTE — Telephone Encounter (Signed)
Appointment rescheduled from 11/28 to today, 11/22.

## 2021-04-08 NOTE — Progress Notes (Signed)
BP 105/71   Pulse 79   Temp 98.1 F (36.7 C)   Wt 209 lb 12.8 oz (95.2 kg)   SpO2 97%   BMI 34.91 kg/m    Subjective:    Patient ID: Candace Reed, female    DOB: 25-Jul-1975, 45 y.o.   MRN: [id number]  HPI: Candace Reed is a 45 y.o. female  Chief Complaint  Patient presents with  . Back Pain    Patient states she has been having back pain x 1 week. Has gotten worse the past three days.   BACK PAIN Duration: about a week Mechanism of injury: pushing while bending over Location: Left and low back Onset: sudden Severity: moderate Quality: aching and sore Frequency: constant Radiation: none Aggravating factors: certain positions Alleviating factors: stretching Status: better Treatments attempted: rest, heat, aleve, and HEP  Relief with NSAIDs?: mild Nighttime pain:  no Paresthesias / decreased sensation:  no Bowel / bladder incontinence:  no Fevers:  no Dysuria / urinary frequency:  no   Relevant past medical, surgical, family and social history reviewed and updated as indicated. Interim medical history since our last visit reviewed. Allergies and medications reviewed and updated.  Review of Systems  Constitutional: Negative.   Respiratory: Negative.    Cardiovascular: Negative.   Gastrointestinal: Negative.   Musculoskeletal:  Positive for back pain and myalgias. Negative for arthralgias, gait problem, joint swelling, neck pain and neck stiffness.  Skin: Negative.   Neurological: Negative.   Psychiatric/Behavioral: Negative.     Per HPI unless specifically indicated above     Objective:    BP 105/71   Pulse 79   Temp 98.1 F (36.7 C)   Wt 209 lb 12.8 oz (95.2 kg)   SpO2 97%   BMI 34.91 kg/m   Wt Readings from Last 3 Encounters:  04/08/21 209 lb 12.8 oz (95.2 kg)  02/25/21 208 lb 3.2 oz (94.4 kg)  10/15/20 207 lb 3.2 oz (94 kg)    Physical Exam Vitals and nursing note reviewed.  Constitutional:      General: She is not in acute distress.     Appearance: Normal appearance. She is not ill-appearing, toxic-appearing or diaphoretic.  HENT:     Head: Normocephalic and atraumatic.     Right Ear: External ear normal.     Left Ear: External ear normal.     Nose: Nose normal.     Mouth/Throat:     Mouth: Mucous membranes are moist.     Pharynx: Oropharynx is clear.  Eyes:     General: No scleral icterus.       Right eye: No discharge.        Left eye: No discharge.     Extraocular Movements: Extraocular movements intact.     Conjunctiva/sclera: Conjunctivae normal.     Pupils: Pupils are equal, round, and reactive to light.  Cardiovascular:     Rate and Rhythm: Normal rate and regular rhythm.     Pulses: Normal pulses.     Heart sounds: Normal heart sounds. No murmur heard.   No friction rub. No gallop.  Pulmonary:     Effort: Pulmonary effort is normal. No respiratory distress.     Breath sounds: Normal breath sounds. No stridor. No wheezing, rhonchi or rales.  Chest:     Chest wall: No tenderness.  Musculoskeletal:        General: Normal range of motion.     Cervical back: Normal range of motion and neck supple.  Skin:    General: Skin is warm and dry.     Capillary Refill: Capillary refill takes less than 2 seconds.     Coloration: Skin is not jaundiced or pale.     Findings: No bruising, erythema, lesion or rash.  Neurological:     General: No focal deficit present.     Mental Status: She is alert and oriented to person, place, and time. Mental status is at baseline.  Psychiatric:        Mood and Affect: Mood normal.        Behavior: Behavior normal.        Thought Content: Thought content normal.        Judgment: Judgment normal.    Results for orders placed or performed in visit on 10/15/20  Microscopic Examination   Urine  Result Value Ref Range   WBC, UA None seen 0 - 5 /hpf   RBC 0-2 0 - 2 /hpf   Epithelial Cells (non renal) None seen 0 - 10 /hpf   Bacteria, UA None seen None seen/Few  CBC with  Differential/Platelet  Result Value Ref Range   WBC 5.2 3.4 - 10.8 x10E3/uL   RBC 4.20 3.77 - 5.28 x10E6/uL   Hemoglobin 12.0 11.1 - 15.9 g/dL   Hematocrit 37.5 34.0 - 46.6 %   MCV 89 79 - 97 fL   MCH 28.6 26.6 - 33.0 pg   MCHC 32.0 31.5 - 35.7 g/dL   RDW 13.5 11.7 - 15.4 %   Platelets 391 150 - 450 x10E3/uL   Neutrophils 54 Not Estab. %   Lymphs 31 Not Estab. %   Monocytes 11 Not Estab. %   Eos 4 Not Estab. %   Basos 0 Not Estab. %   Neutrophils Absolute 2.8 1.4 - 7.0 x10E3/uL   Lymphocytes Absolute 1.6 0.7 - 3.1 x10E3/uL   Monocytes Absolute 0.6 0.1 - 0.9 x10E3/uL   EOS (ABSOLUTE) 0.2 0.0 - 0.4 x10E3/uL   Basophils Absolute 0.0 0.0 - 0.2 x10E3/uL   Immature Granulocytes 0 Not Estab. %   Immature Grans (Abs) 0.0 0.0 - 0.1 x10E3/uL  Comprehensive metabolic panel  Result Value Ref Range   Glucose 87 65 - 99 mg/dL   BUN 13 6 - 24 mg/dL   Creatinine, Ser 0.84 0.57 - 1.00 mg/dL   eGFR 88 >59 mL/min/1.73   BUN/Creatinine Ratio 15 9 - 23   Sodium 139 134 - 144 mmol/L   Potassium 4.5 3.5 - 5.2 mmol/L   Chloride 103 96 - 106 mmol/L   CO2 23 20 - 29 mmol/L   Calcium 9.5 8.7 - 10.2 mg/dL   Total Protein 6.8 6.0 - 8.5 g/dL   Albumin 4.1 3.8 - 4.8 g/dL   Globulin, Total 2.7 1.5 - 4.5 g/dL   Albumin/Globulin Ratio 1.5 1.2 - 2.2   Bilirubin Total 0.4 0.0 - 1.2 mg/dL   Alkaline Phosphatase 64 44 - 121 IU/L   AST 15 0 - 40 IU/L   ALT 12 0 - 32 IU/L  Lipid Panel w/o Chol/HDL Ratio  Result Value Ref Range   Cholesterol, Total 168 100 - 199 mg/dL   Triglycerides 234 (H) 0 - 149 mg/dL   HDL 56 >39 mg/dL   VLDL Cholesterol Cal 38 5 - 40 mg/dL   LDL Chol Calc (NIH) 74 0 - 99 mg/dL  Urinalysis, Routine w reflex microscopic  Result Value Ref Range   Specific Gravity, UA 1.020 1.005 - 1.030   pH,  UA 5.5 5.0 - 7.5   Color, UA Yellow Yellow   Appearance Ur Clear Clear   Leukocytes,UA Negative Negative   Protein,UA Negative Negative/Trace   Glucose, UA Negative Negative   Ketones, UA  Negative Negative   RBC, UA Trace (A) Negative   Bilirubin, UA Negative Negative   Urobilinogen, Ur 0.2 0.2 - 1.0 mg/dL   Nitrite, UA Negative Negative   Microscopic Examination See below:   TSH  Result Value Ref Range   TSH 1.670 0.450 - 4.500 uIU/mL  HIV Antibody (routine testing w rflx)  Result Value Ref Range   HIV Screen 4th Generation wRfx Non Reactive Non Reactive  Hepatitis C Antibody  Result Value Ref Range   Hep C Virus Ab <0.1 0.0 - 0.9 s/co ratio      Assessment & Plan:   Problem List Items Addressed This Visit   None Visit Diagnoses     Acute left-sided low back pain without sciatica    -  Primary   Will treat with toradol shot followed by flexeril and naproxen. Stretches given. Call if not gettin g better or getting worse. Call with any concerns.    Relevant Medications   ketorolac (TORADOL) injection 60 mg (Start on 04/08/2021  3:00 PM)   naproxen (NAPROSYN) 500 MG tablet   cyclobenzaprine (FLEXERIL) 10 MG tablet        Follow up plan: Return if symptoms worsen or fail to improve.

## 2021-04-14 ENCOUNTER — Telehealth: Payer: 59 | Admitting: Family Medicine

## 2021-05-05 ENCOUNTER — Encounter: Payer: Self-pay | Admitting: Nurse Practitioner

## 2021-05-05 ENCOUNTER — Ambulatory Visit (INDEPENDENT_AMBULATORY_CARE_PROVIDER_SITE_OTHER): Payer: 59 | Admitting: Nurse Practitioner

## 2021-05-05 ENCOUNTER — Other Ambulatory Visit: Payer: Self-pay

## 2021-05-05 VITALS — BP 103/72 | HR 97 | Temp 98.3°F | Wt 207.8 lb

## 2021-05-05 DIAGNOSIS — R051 Acute cough: Secondary | ICD-10-CM

## 2021-05-05 DIAGNOSIS — J101 Influenza due to other identified influenza virus with other respiratory manifestations: Secondary | ICD-10-CM | POA: Diagnosis not present

## 2021-05-05 LAB — VERITOR FLU A/B WAIVED
Influenza A: POSITIVE — AB
Influenza B: NEGATIVE

## 2021-05-05 MED ORDER — OSELTAMIVIR PHOSPHATE 75 MG PO CAPS: 75.0000 mg | ORAL_CAPSULE | Freq: Two times a day (BID) | ORAL | 0 refills | Status: DC

## 2021-05-05 NOTE — Progress Notes (Signed)
Acute Office Visit  Subjective:    Patient ID: Candace Reed, female    DOB: [birth year], 45 y.o.   MRN: [id number]  Chief Complaint  Patient presents with   URI    Pt states she started having symptoms of cough, congestion, headache, fever, and body aches since Friday evening. States home covid test were negative. States she has been taking OTC meds, Mucinex and Theraflu that have not really helped     HPI Patient is in today for cough, congestion, body aches, and fever for 3 days. Home covid-19 test was negative.   UPPER RESPIRATORY TRACT INFECTION  Fever: yes Cough: yes Shortness of breath: no Wheezing: no Chest pain: no Chest tightness: no Chest congestion: no Nasal congestion: yes Runny nose: yes Post nasal drip: yes Sneezing: no Sore throat: yes  Swollen glands: no Sinus pressure: yes Headache: yes Face pain: no Toothache: no Ear pain: no  Ear pressure: no  Eyes red/itching:no Eye drainage/crusting: no  Vomiting: no Rash: no Fatigue: yes Sick contacts: yes Strep contacts: no  Context: stable Recurrent sinusitis: no Relief with OTC cold/cough medications: yes  Treatments attempted: mucinex, theraflu, ibuprofen, tea   Past Medical History:  Diagnosis Date   Allergy     No past surgical history on file.  Family History  Problem Relation Age of Onset   Cancer Mother        Thyroid   Allergies Son    Birth defects Maternal Grandmother    Birth defects Maternal Grandfather    Breast cancer Neg Hx     Social History   Socioeconomic History   Marital status: Married    Spouse name: Not on file   Number of children: Not on file   Years of education: Not on file   Highest education level: Not on file  Occupational History   Not on file  Tobacco Use   Smoking status: Former    Types: Cigarettes    Quit date: 05/19/2003    Years since quitting: 17.9   Smokeless tobacco: Never  Vaping Use   Vaping Use: Never used  Substance and Sexual  Activity   Alcohol use: Yes    Alcohol/week: 4.0 standard drinks    Types: 2 Glasses of wine, 2 Cans of beer per week   Drug use: Never   Sexual activity: Yes    Birth control/protection: None  Other Topics Concern   Not on file  Social History Narrative   Not on file   Social Determinants of Health   Financial Resource Strain: Not on file  Food Insecurity: Not on file  Transportation Needs: Not on file  Physical Activity: Not on file  Stress: Not on file  Social Connections: Not on file  Intimate Partner Violence: Not on file    Outpatient Medications Prior to Visit  Medication Sig Dispense Refill   fluticasone (FLONASE) 50 MCG/ACT nasal spray Place 2 sprays into both nostrils 2 (two) times daily. 16 g 12   montelukast (SINGULAIR) 10 MG tablet Take 1 tablet (10 mg total) by mouth at bedtime. 90 tablet 3   cyclobenzaprine (FLEXERIL) 10 MG tablet Take 1 tablet (10 mg total) by mouth at bedtime as needed for muscle spasms. (Patient not taking: Reported on 05/05/2021) 30 tablet 0   naproxen (NAPROSYN) 500 MG tablet Take 1 tablet (500 mg total) by mouth 2 (two) times daily with a meal. (Patient not taking: Reported on 05/05/2021) 60 tablet 2   No facility-administered medications prior  to visit.    No Known Allergies  Review of Systems  Constitutional:  Positive for fatigue and fever.  HENT:  Positive for congestion, postnasal drip, rhinorrhea, sinus pressure and sore throat. Negative for ear pain and sneezing.   Eyes: Negative.   Respiratory:  Positive for cough. Negative for shortness of breath.   Cardiovascular: Negative.   Gastrointestinal: Negative.   Endocrine: Negative.   Genitourinary: Negative.   Musculoskeletal:  Positive for myalgias.  Skin: Negative.   Neurological:  Positive for headaches. Negative for dizziness.  Psychiatric/Behavioral: Negative.        Objective:    Physical Exam Vitals and nursing note reviewed.  Constitutional:      General: She is  not in acute distress.    Appearance: Normal appearance.  HENT:     Head: Normocephalic.     Right Ear: Tympanic membrane, ear canal and external ear normal.     Left Ear: Tympanic membrane, ear canal and external ear normal.  Eyes:     Conjunctiva/sclera: Conjunctivae normal.  Cardiovascular:     Rate and Rhythm: Normal rate and regular rhythm.     Pulses: Normal pulses.     Heart sounds: Normal heart sounds.  Pulmonary:     Effort: Pulmonary effort is normal.     Breath sounds: Normal breath sounds.  Musculoskeletal:     Cervical back: Normal range of motion. No tenderness.  Lymphadenopathy:     Cervical: No cervical adenopathy.  Skin:    General: Skin is warm.  Neurological:     General: No focal deficit present.     Mental Status: She is alert and oriented to person, place, and time.  Psychiatric:        Mood and Affect: Mood normal.        Behavior: Behavior normal.        Thought Content: Thought content normal.        Judgment: Judgment normal.    BP 103/72    Pulse 97    Temp 98.3 F (36.8 C) (Oral)    Wt 207 lb 12.8 oz (94.3 kg)    SpO2 96%    BMI 34.58 kg/m  Wt Readings from Last 3 Encounters:  05/05/21 207 lb 12.8 oz (94.3 kg)  04/08/21 209 lb 12.8 oz (95.2 kg)  02/25/21 208 lb 3.2 oz (94.4 kg)    Health Maintenance Due  Topic Date Due   COVID-19 Vaccine (1) Never done   INFLUENZA VACCINE  Never done    There are no preventive care reminders to display for this patient.   Lab Results  Component Value Date   TSH 1.670 10/15/2020   Lab Results  Component Value Date   WBC 5.2 10/15/2020   HGB 12.0 10/15/2020   HCT 37.5 10/15/2020   MCV 89 10/15/2020   PLT 391 10/15/2020   Lab Results  Component Value Date   NA 139 10/15/2020   K 4.5 10/15/2020   CO2 23 10/15/2020   GLUCOSE 87 10/15/2020   BUN 13 10/15/2020   CREATININE 0.84 10/15/2020   BILITOT 0.4 10/15/2020   ALKPHOS 64 10/15/2020   AST 15 10/15/2020   ALT 12 10/15/2020   PROT 6.8  10/15/2020   ALBUMIN 4.1 10/15/2020   CALCIUM 9.5 10/15/2020   EGFR 88 10/15/2020   Lab Results  Component Value Date   CHOL 168 10/15/2020   Lab Results  Component Value Date   HDL 56 10/15/2020   Lab Results  Component Value Date   LDLCALC 74 10/15/2020   Lab Results  Component Value Date   TRIG 234 (H) 10/15/2020   No results found for: CHOLHDL No results found for: HGBA1C     Assessment & Plan:   Problem List Items Addressed This Visit   None Visit Diagnoses     Acute cough    -  Primary   Flu positive, covid-19 pending.   Relevant Orders   Veritor Flu A/B Waived   Novel Coronavirus, NAA ([facility])   Influenza A       Will treat with tamiflu. Encouraged rest and fluids. Can continue OTC medicines for symptom management. F/U if not improving.    Relevant Medications   oseltamivir (TAMIFLU) 75 MG capsule        Meds ordered this encounter  Medications   oseltamivir (TAMIFLU) 75 MG capsule    Sig: Take 1 capsule (75 mg total) by mouth 2 (two) times daily.    Dispense:  10 capsule    Refill:  0     Charyl Dancer, NP

## 2021-05-06 LAB — SARS-COV-2, NAA 2 DAY TAT

## 2021-05-06 LAB — NOVEL CORONAVIRUS, NAA: SARS-CoV-2, NAA: NOT DETECTED

## 2021-05-08 ENCOUNTER — Encounter: Payer: Self-pay | Admitting: Family Medicine

## 2021-05-09 ENCOUNTER — Telehealth (INDEPENDENT_AMBULATORY_CARE_PROVIDER_SITE_OTHER): Payer: 59 | Admitting: Nurse Practitioner

## 2021-05-09 ENCOUNTER — Encounter: Payer: Self-pay | Admitting: Nurse Practitioner

## 2021-05-09 DIAGNOSIS — J101 Influenza due to other identified influenza virus with other respiratory manifestations: Secondary | ICD-10-CM

## 2021-05-09 DIAGNOSIS — J0100 Acute maxillary sinusitis, unspecified: Secondary | ICD-10-CM | POA: Diagnosis not present

## 2021-05-09 MED ORDER — AMOXICILLIN-POT CLAVULANATE 875-125 MG PO TABS
1.0000 | ORAL_TABLET | Freq: Two times a day (BID) | ORAL | 0 refills | Status: AC
Start: 2021-05-09 — End: 2021-05-19

## 2021-05-09 NOTE — Progress Notes (Signed)
There were no vitals taken for this visit.   Subjective:    Patient ID: Candace Reed, female    DOB: [birth year], 45 y.o.   MRN: [id number]  HPI: Candace Reed is a 45 y.o. female  Chief Complaint  Patient presents with   URI    Pt states she is still on Tamiflu, tested positive for flu A on Monday. States she is still very congested and having sinus pressure.    FLU POSITIVE Patient states she tested positive for Flu a on Monday.  She has been on Tamiflu since Monday. She states her cough won't go away.  It is a shallow hacking cough.  States that her head, cheeks, face and gums are all hurting.  She has more congestion in her head than she did at the beginning of the week.   Relevant past medical, surgical, family and social history reviewed and updated as indicated. Interim medical history since our last visit reviewed. Allergies and medications reviewed and updated.  Review of Systems  HENT:  Positive for congestion.        Face pain and tooth ache  Respiratory:  Positive for cough.   Neurological:  Positive for headaches.   Per HPI unless specifically indicated above     Objective:    There were no vitals taken for this visit.  Wt Readings from Last 3 Encounters:  05/05/21 207 lb 12.8 oz (94.3 kg)  04/08/21 209 lb 12.8 oz (95.2 kg)  02/25/21 208 lb 3.2 oz (94.4 kg)    Physical Exam Vitals and nursing note reviewed.  HENT:     Head: Normocephalic.     Right Ear: Hearing normal.     Left Ear: Hearing normal.     Nose: Nose normal.  Eyes:     Pupils: Pupils are equal, round, and reactive to light.  Pulmonary:     Effort: Pulmonary effort is normal. No respiratory distress.  Neurological:     Mental Status: She is alert.  Psychiatric:        Mood and Affect: Mood normal.        Behavior: Behavior normal.        Thought Content: Thought content normal.        Judgment: Judgment normal.    Results for orders placed or performed in visit on 05/05/21  Novel  Coronavirus, NAA ([facility])   Specimen: Nasopharyngeal(NP) swabs in vial transport medium  Result Value Ref Range   SARS-CoV-2, NAA Not Detected Not Detected  SARS-COV-2, NAA 2 DAY TAT  Result Value Ref Range   SARS-CoV-2, NAA 2 DAY TAT Performed   Veritor Flu A/B Waived  Result Value Ref Range   Influenza A Positive (A) Negative   Influenza B Negative Negative      Assessment & Plan:   Problem List Items Addressed This Visit   None Visit Diagnoses     Acute non-recurrent maxillary sinusitis    -  Primary   Will treat with Augumentin. Discussed completing course of antibiotics. Continue with OTC symptom managment. FU if symptoms do not improve.   Relevant Medications   amoxicillin-clavulanate (AUGMENTIN) 875-125 MG tablet   Influenza A       Patient's symptoms not improving. Treating with Augumentin due to concern for Sinus infection. Patient will be on antibiotics and would cover pnemonia.   Relevant Orders   DG Chest 2 View        Follow up plan: No follow-ups on file.   This  visit was completed via MyChart due to the restrictions of the COVID-19 pandemic. All issues as above were discussed and addressed. Physical exam was done as above through visual confirmation on MyChart. If it was felt that the patient should be evaluated in the office, they were directed there. The patient verbally consented to this visit. Location of the patient: Home Location of the provider: Office Those involved with this call:  Provider: Larae Grooms, NP CMA: Wilhemena Durie, CMA Front Desk/Registration: [employer] This encounter was conducted via video.  I spent 15 dedicated to the care of this patient on the date of this encounter to include previsit review of 20, face to face time with the patient, and post visit ordering of testing.

## 2021-05-09 NOTE — Telephone Encounter (Signed)
Appt scheduled

## 2021-05-09 NOTE — Telephone Encounter (Signed)
Does anyone have a spot to see her today?

## 2021-05-09 NOTE — Telephone Encounter (Signed)
Candace Reed can see her virtually or in person at 11:20

## 2021-05-13 ENCOUNTER — Ambulatory Visit: Payer: 59 | Admitting: Family Medicine

## 2021-07-08 ENCOUNTER — Encounter: Payer: Self-pay | Admitting: Family Medicine

## 2021-09-10 ENCOUNTER — Encounter: Payer: Self-pay | Admitting: Family Medicine

## 2021-09-17 ENCOUNTER — Ambulatory Visit: Payer: Self-pay | Admitting: *Deleted

## 2021-09-17 DIAGNOSIS — S92413A Displaced fracture of proximal phalanx of unspecified great toe, initial encounter for closed fracture: Secondary | ICD-10-CM | POA: Insufficient documentation

## 2021-09-17 NOTE — Telephone Encounter (Signed)
?  Chief Complaint: Possible broken toe ?Symptoms: "Stubbed" small toe on left foot."Pointing out, opposite way it should be."  ?Frequency: Few minutes prior to call ?Pertinent Negatives: Patient denies Not swollen as of yet ?Disposition: [] ED /[x] Urgent Care (no appt availability in office) / [] Appointment(In office/virtual)/ []  [facility]/ [] Home Care/ [] Refused Recommended Disposition /[] [facility]/ []  Follow-up with PCP ?Additional Notes: States will follow disposition, care advise provided, verbalizes understanding. ?Reason for Disposition ? Looks like a broken bone (e.g., crooked or deformed) ? ?Answer Assessment - Initial Assessment Questions ?1. MECHANISM: "How did the injury happen?"  ?    "Stubbed small toe,left" ?2. ONSET: "When did the injury happen?" (Minutes or hours ago)  ?    Few minutes ago ?3. LOCATION: "What part of the toe is injured?" "Is the nail damaged?"  ?    Entire toe ?4. APPEARANCE of TOE INJURY: "What does the injury look like?"  ?     ?5. SEVERITY: "Can you use the foot normally?" "Can you walk?"  ?    *No Answer* ?6. SIZE: For cuts, bruises, or swelling, ask: "How large is it?" (e.g., inches or centimeters;  entire toe)  ?    No ?7. PAIN: "Is there pain?" If Yes, ask: "How bad is the pain?"   (e.g., Scale 1-10; or mild, moderate, severe) ?    Painful Throbbing ?8. TETANUS: For any breaks in the skin, ask: "When was the last tetanus booster?" ?    *No Answer* ?9. DIABETES: "Do you have a history of diabetes or poor circulation in the feet?" ?    *No Answer* ?10. OTHER SYMPTOMS: "Do you have any other symptoms?"  ?      swelling ? ?Protocols used: Toe Injury-A-AH ? ?

## 2021-09-26 DIAGNOSIS — S92512A Displaced fracture of proximal phalanx of left lesser toe(s), initial encounter for closed fracture: Secondary | ICD-10-CM | POA: Diagnosis not present

## 2021-10-06 DIAGNOSIS — S92911D Unspecified fracture of right toe(s), subsequent encounter for fracture with routine healing: Secondary | ICD-10-CM | POA: Diagnosis not present

## 2021-10-17 ENCOUNTER — Encounter: Payer: 59 | Admitting: Family Medicine

## 2021-10-30 ENCOUNTER — Ambulatory Visit (INDEPENDENT_AMBULATORY_CARE_PROVIDER_SITE_OTHER): Payer: 59 | Admitting: Family Medicine

## 2021-10-30 ENCOUNTER — Encounter: Payer: Self-pay | Admitting: Family Medicine

## 2021-10-30 VITALS — BP 101/69 | HR 71 | Temp 97.9°F | Ht 66.0 in | Wt 219.8 lb

## 2021-10-30 DIAGNOSIS — N926 Irregular menstruation, unspecified: Secondary | ICD-10-CM | POA: Diagnosis not present

## 2021-10-30 DIAGNOSIS — G2581 Restless legs syndrome: Secondary | ICD-10-CM

## 2021-10-30 DIAGNOSIS — Z0000 Encounter for general adult medical examination without abnormal findings: Secondary | ICD-10-CM

## 2021-10-30 LAB — MICROSCOPIC EXAMINATION
Bacteria, UA: NONE SEEN
WBC, UA: NONE SEEN /hpf (ref 0–5)

## 2021-10-30 LAB — URINALYSIS, ROUTINE W REFLEX MICROSCOPIC
Bilirubin, UA: NEGATIVE
Glucose, UA: NEGATIVE
Ketones, UA: NEGATIVE
Leukocytes,UA: NEGATIVE
Nitrite, UA: NEGATIVE
Protein,UA: NEGATIVE
Specific Gravity, UA: 1.01 (ref 1.005–1.030)
Urobilinogen, Ur: 0.2 mg/dL (ref 0.2–1.0)
pH, UA: 6 (ref 5.0–7.5)

## 2021-10-30 NOTE — Patient Instructions (Signed)
Please call to schedule your mammogram and/or bone density: ?[facility]  ?Address: [address], [place], [place] [address] ?Phone: [phone]  ?

## 2021-10-30 NOTE — Progress Notes (Signed)
BP 101/69   Pulse 71   Temp 97.9 F (36.6 C)   Ht 5\' 6"  (1.676 m)   Wt 219 lb 12.8 oz (99.7 kg)   SpO2 98%   BMI 35.48 kg/m    Subjective:    Patient ID: Candace Reed, female    DOB: 1976/04/04, 46 y.o.   MRN: [id number]  HPI: Candace Reed is a 46 y.o. female presenting on 10/30/2021 for comprehensive medical examination. Current medical complaints include:  Period 2x last month. Has been having worsening RLS.  She currently lives with: husband and kids Menopausal Symptoms: yes  Depression Screen done today and results listed below:     10/30/2021    9:34 AM 10/15/2020    8:13 AM 10/13/2019    8:33 AM 10/02/2019    2:42 PM 06/10/2018    8:38 AM  Depression screen PHQ 2/9  Decreased Interest 0 0 0 0 0  Down, Depressed, Hopeless 0 0 0 0 0  PHQ - 2 Score 0 0 0 0 0  Altered sleeping 0  0 0 0  Tired, decreased energy 0  0 0 0  Change in appetite 2  0 0 0  Feeling bad or failure about yourself  0  0 0 0  Trouble concentrating 0  0 0 0  Moving slowly or fidgety/restless 0  0 0 0  Suicidal thoughts 0  0 0 0  PHQ-9 Score 2  0 0 0  Difficult doing work/chores Not difficult at all  Not difficult at all Not difficult at all Not difficult at all    Past Medical History:  Past Medical History:  Diagnosis Date   Allergy     Surgical History:  History reviewed. No pertinent surgical history.  Medications:  Current Outpatient Medications on File Prior to Visit  Medication Sig   fluticasone (FLONASE) 50 MCG/ACT nasal spray Place 2 sprays into both nostrils 2 (two) times daily.   montelukast (SINGULAIR) 10 MG tablet Take 1 tablet (10 mg total) by mouth at bedtime.   No current facility-administered medications on file prior to visit.    Allergies:  No Known Allergies  Social History:  Social History   Socioeconomic History   Marital status: Married    Spouse name: Not on file   Number of children: Not on file   Years of education: Not on file   Highest education  level: Not on file  Occupational History   Not on file  Tobacco Use   Smoking status: Former    Types: Cigarettes    Quit date: 05/19/2003    Years since quitting: 18.4   Smokeless tobacco: Never  Vaping Use   Vaping Use: Never used  Substance and Sexual Activity   Alcohol use: Yes    Alcohol/week: 4.0 standard drinks of alcohol    Types: 2 Glasses of wine, 2 Cans of beer per week   Drug use: Never   Sexual activity: Yes    Birth control/protection: None  Other Topics Concern   Not on file  Social History Narrative   Not on file   Social Determinants of Health   Financial Resource Strain: Not on file  Food Insecurity: Not on file  Transportation Needs: Not on file  Physical Activity: Not on file  Stress: Not on file  Social Connections: Not on file  Intimate Partner Violence: Not on file   Social History   Tobacco Use  Smoking Status Former   Types: Cigarettes  Quit date: 05/19/2003   Years since quitting: 18.4  Smokeless Tobacco Never   Social History   Substance and Sexual Activity  Alcohol Use Yes   Alcohol/week: 4.0 standard drinks of alcohol   Types: 2 Glasses of wine, 2 Cans of beer per week    Family History:  Family History  Problem Relation Age of Onset   Cancer Mother        Thyroid   Allergies Son    Birth defects Maternal Grandmother    Birth defects Maternal Grandfather    Breast cancer Neg Hx     Past medical history, surgical history, medications, allergies, family history and social history reviewed with patient today and changes made to appropriate areas of the chart.   Review of Systems  Constitutional: Negative.   HENT: Negative.    Eyes:  Positive for blurred vision. Negative for double vision, photophobia, pain, discharge and redness.  Respiratory: Negative.    Cardiovascular: Negative.   Gastrointestinal: Negative.   Genitourinary: Negative.   Musculoskeletal:  Positive for myalgias. Negative for back pain, falls, joint pain  and neck pain.  Skin: Negative.   Neurological:  Positive for tingling. Negative for dizziness, tremors, sensory change, speech change, focal weakness, seizures, loss of consciousness, weakness and headaches.  Endo/Heme/Allergies: Negative.   Psychiatric/Behavioral: Negative.     All other ROS negative except what is listed above and in the HPI.      Objective:    BP 101/69   Pulse 71   Temp 97.9 F (36.6 C)   Ht 5\' 6"  (1.676 m)   Wt 219 lb 12.8 oz (99.7 kg)   SpO2 98%   BMI 35.48 kg/m   Wt Readings from Last 3 Encounters:  10/30/21 219 lb 12.8 oz (99.7 kg)  05/05/21 207 lb 12.8 oz (94.3 kg)  04/08/21 209 lb 12.8 oz (95.2 kg)    Physical Exam Vitals and nursing note reviewed.  Constitutional:      General: She is not in acute distress.    Appearance: Normal appearance. She is obese. She is not ill-appearing, toxic-appearing or diaphoretic.  HENT:     Head: Normocephalic and atraumatic.     Right Ear: Tympanic membrane, ear canal and external ear normal. There is no impacted cerumen.     Left Ear: Tympanic membrane, ear canal and external ear normal. There is no impacted cerumen.     Nose: Nose normal. No congestion or rhinorrhea.     Mouth/Throat:     Mouth: Mucous membranes are moist.     Pharynx: Oropharynx is clear. No oropharyngeal exudate or posterior oropharyngeal erythema.  Eyes:     General: No scleral icterus.       Right eye: No discharge.        Left eye: No discharge.     Extraocular Movements: Extraocular movements intact.     Conjunctiva/sclera: Conjunctivae normal.     Pupils: Pupils are equal, round, and reactive to light.  Neck:     Vascular: No carotid bruit.  Cardiovascular:     Rate and Rhythm: Normal rate and regular rhythm.     Pulses: Normal pulses.     Heart sounds: No murmur heard.    No friction rub. No gallop.  Pulmonary:     Effort: Pulmonary effort is normal. No respiratory distress.     Breath sounds: Normal breath sounds. No  stridor. No wheezing, rhonchi or rales.  Chest:     Chest wall: No tenderness.  Abdominal:     General: Abdomen is flat. Bowel sounds are normal. There is no distension.     Palpations: Abdomen is soft. There is no mass.     Tenderness: There is no abdominal tenderness. There is no right CVA tenderness, left CVA tenderness, guarding or rebound.     Hernia: No hernia is present.  Genitourinary:    Comments: Breast and pelvic exams deferred with shared decision making Musculoskeletal:        General: No swelling, tenderness, deformity or signs of injury.     Cervical back: Normal range of motion and neck supple. No rigidity. No muscular tenderness.     Right lower leg: No edema.     Left lower leg: No edema.  Lymphadenopathy:     Cervical: No cervical adenopathy.  Skin:    General: Skin is warm and dry.     Capillary Refill: Capillary refill takes less than 2 seconds.     Coloration: Skin is not jaundiced or pale.     Findings: No bruising, erythema, lesion or rash.  Neurological:     General: No focal deficit present.     Mental Status: She is alert and oriented to person, place, and time. Mental status is at baseline.     Cranial Nerves: No cranial nerve deficit.     Sensory: No sensory deficit.     Motor: No weakness.     Coordination: Coordination normal.     Gait: Gait normal.     Deep Tendon Reflexes: Reflexes normal.  Psychiatric:        Mood and Affect: Mood normal.        Behavior: Behavior normal.        Thought Content: Thought content normal.        Judgment: Judgment normal.     Results for orders placed or performed in visit on 10/30/21  Microscopic Examination   Urine  Result Value Ref Range   WBC, UA None seen 0 - 5 /hpf   RBC 0-2 0 - 2 /hpf   Epithelial Cells (non renal) 0-10 0 - 10 /hpf   Bacteria, UA None seen None seen/Few  Urinalysis, Routine w reflex microscopic  Result Value Ref Range   Specific Gravity, UA 1.010 1.005 - 1.030   pH, UA 6.0 5.0 -  7.5   Color, UA Yellow Yellow   Appearance Ur Clear Clear   Leukocytes,UA Negative Negative   Protein,UA Negative Negative/Trace   Glucose, UA Negative Negative   Ketones, UA Negative Negative   RBC, UA Trace (A) Negative   Bilirubin, UA Negative Negative   Urobilinogen, Ur 0.2 0.2 - 1.0 mg/dL   Nitrite, UA Negative Negative   Microscopic Examination See below:       Assessment & Plan:   Problem List Items Addressed This Visit   None Visit Diagnoses     Routine general medical examination at a health care facility    -  Primary   Vaccines up to date/declined. Screening labs checked today. Pap up to date. Mammogram ordered today. Continue diet and exercise. Call with any concerns.    Relevant Orders   CBC with Differential/Platelet   Comprehensive metabolic panel   Lipid Panel w/o Chol/HDL Ratio   Urinalysis, Routine w reflex microscopic (Completed)   TSH   Abnormal menstrual periods       Will check labs, if continues may need [place]. Await results.    Relevant Orders   TSH   Estradiol  LH   FSH   Testosterone, free, total([facility])   RLS (restless legs syndrome)       Checking labs today. Await results. Treat as needed.    Relevant Orders   Ferritin        Follow up plan: Return in about 1 year (around 10/31/2022) for physical.   LABORATORY TESTING:  - Pap smear: up to date  IMMUNIZATIONS:   - Tdap: Tetanus vaccination status reviewed: last tetanus booster within 10 years. - Influenza: Postponed to flu season - Pneumovax: Not applicable - Prevnar: Not applicable - COVID: Refused - HPV: Refused - Shingrix vaccine: Not applicable  SCREENING: -Mammogram: Ordered today   PATIENT COUNSELING:   Advised to take 1 mg of folate supplement per day if capable of pregnancy.   Sexuality: Discussed sexually transmitted diseases, partner selection, use of condoms, avoidance of unintended pregnancy  and contraceptive alternatives.   Advised to avoid cigarette  smoking.  I discussed with the patient that most people either abstain from alcohol or drink within safe limits (<=14/week and <=4 drinks/occasion for males, <=7/weeks and <= 3 drinks/occasion for females) and that the risk for alcohol disorders and other health effects rises proportionally with the number of drinks per week and how often a drinker exceeds daily limits.  Discussed cessation/primary prevention of drug use and availability of treatment for abuse.   Diet: Encouraged to adjust caloric intake to maintain  or achieve ideal body weight, to reduce intake of dietary saturated fat and total fat, to limit sodium intake by avoiding high sodium foods and not adding table salt, and to maintain adequate dietary potassium and calcium preferably from fresh fruits, vegetables, and low-fat dairy products.    stressed the importance of regular exercise  Injury prevention: Discussed safety belts, safety helmets, smoke detector, smoking near bedding or upholstery.   Dental health: Discussed importance of regular tooth brushing, flossing, and dental visits.    NEXT PREVENTATIVE PHYSICAL DUE IN 1 YEAR. Return in about 1 year (around 10/31/2022) for physical.

## 2021-11-04 ENCOUNTER — Other Ambulatory Visit: Payer: Self-pay | Admitting: Family Medicine

## 2021-11-04 LAB — CBC WITH DIFFERENTIAL/PLATELET
Basophils Absolute: 0 10*3/uL (ref 0.0–0.2)
Basos: 1 %
EOS (ABSOLUTE): 0.1 10*3/uL (ref 0.0–0.4)
Eos: 3 %
Hematocrit: 40.3 % (ref 34.0–46.6)
Hemoglobin: 13.2 g/dL (ref 11.1–15.9)
Immature Grans (Abs): 0 10*3/uL (ref 0.0–0.1)
Immature Granulocytes: 0 %
Lymphocytes Absolute: 1.7 10*3/uL (ref 0.7–3.1)
Lymphs: 34 %
MCH: 28.9 pg (ref 26.6–33.0)
MCHC: 32.8 g/dL (ref 31.5–35.7)
MCV: 88 fL (ref 79–97)
Monocytes Absolute: 0.5 10*3/uL (ref 0.1–0.9)
Monocytes: 10 %
Neutrophils Absolute: 2.6 10*3/uL (ref 1.4–7.0)
Neutrophils: 52 %
Platelets: 367 10*3/uL (ref 150–450)
RBC: 4.57 x10E6/uL (ref 3.77–5.28)
RDW: 14.1 % (ref 11.7–15.4)
WBC: 5 10*3/uL (ref 3.4–10.8)

## 2021-11-04 LAB — COMPREHENSIVE METABOLIC PANEL
ALT: 20 IU/L (ref 0–32)
AST: 21 IU/L (ref 0–40)
Albumin/Globulin Ratio: 1.4 (ref 1.2–2.2)
Albumin: 4.2 g/dL (ref 3.8–4.8)
Alkaline Phosphatase: 59 IU/L (ref 44–121)
BUN/Creatinine Ratio: 14 (ref 9–23)
BUN: 13 mg/dL (ref 6–24)
Bilirubin Total: 0.5 mg/dL (ref 0.0–1.2)
CO2: 23 mmol/L (ref 20–29)
Calcium: 9.7 mg/dL (ref 8.7–10.2)
Chloride: 101 mmol/L (ref 96–106)
Creatinine, Ser: 0.9 mg/dL (ref 0.57–1.00)
Globulin, Total: 3 g/dL (ref 1.5–4.5)
Glucose: 98 mg/dL (ref 70–99)
Potassium: 4.8 mmol/L (ref 3.5–5.2)
Sodium: 136 mmol/L (ref 134–144)
Total Protein: 7.2 g/dL (ref 6.0–8.5)
eGFR: 81 mL/min/{1.73_m2} (ref >59)

## 2021-11-04 LAB — TESTOSTERONE, FREE, TOTAL, SHBG
Sex Hormone Binding: 63.6 nmol/L (ref 24.6–122.0)
Testosterone, Free: 0.5 pg/mL (ref 0.0–4.2)
Testosterone: 10 ng/dL (ref 4–50)

## 2021-11-04 LAB — TSH: TSH: 1.72 u[IU]/mL (ref 0.450–4.500)

## 2021-11-04 LAB — LUTEINIZING HORMONE: LH: 5 m[IU]/mL

## 2021-11-04 LAB — FERRITIN: Ferritin: 21 ng/mL (ref 15–150)

## 2021-11-04 LAB — LIPID PANEL W/O CHOL/HDL RATIO
Cholesterol, Total: 200 mg/dL — ABNORMAL HIGH (ref 100–199)
HDL: 72 mg/dL (ref >39)
LDL Chol Calc (NIH): 111 mg/dL — ABNORMAL HIGH (ref 0–99)
Triglycerides: 94 mg/dL (ref 0–149)
VLDL Cholesterol Cal: 17 mg/dL (ref 5–40)

## 2021-11-04 LAB — FOLLICLE STIMULATING HORMONE: FSH: 14.8 m[IU]/mL

## 2021-11-04 LAB — ESTRADIOL: Estradiol: 31.2 pg/mL

## 2021-11-04 NOTE — Telephone Encounter (Signed)
Requested Prescriptions  Pending Prescriptions Disp Refills  . montelukast (SINGULAIR) 10 MG tablet [Pharmacy Med Name: MONTELUKAST SOD 10 MG TABLET] 90 tablet 0    Sig: TAKE 1 TABLET BY MOUTH EVERYDAY AT BEDTIME     Pulmonology:  Leukotriene Inhibitors Passed - 11/04/2021  2:04 AM      Passed - Valid encounter within last 12 months    Recent Outpatient Visits          5 days ago Routine general medical examination at a health care facility   [facility], Megan P, DO   5 months ago Acute non-recurrent maxillary sinusitis   [facility] Larae Grooms, NP   6 months ago Acute cough   [facility] McElwee, Lauren A, NP   7 months ago Acute left-sided low back pain without sciatica   [facility], Megan P, DO   8 months ago Upper respiratory tract infection, unspecified type   [facility] McElwee, Jake Church, NP      Future Appointments            In 1 year Johnson, Oralia Rud, DO [facility]

## 2022-04-06 ENCOUNTER — Encounter: Payer: Self-pay | Admitting: Family Medicine

## 2022-04-08 MED ORDER — OMEPRAZOLE 20 MG PO CPDR: 20.0000 mg | DELAYED_RELEASE_CAPSULE | Freq: Every day | ORAL | 3 refills | Status: DC

## 2022-04-25 IMAGING — MG DIGITAL SCREENING BILAT W/ TOMO W/ CAD
8 series · 8 of 24 positions shown · non-contrast
Comparison: None.

ACR Breast Density Category a: The breast tissue is almost entirely
fatty.

CLINICAL DATA: Screening.

EXAM:
DIGITAL SCREENING BILATERAL MAMMOGRAM WITH TOMO AND CAD

[R MLO synth-2D]
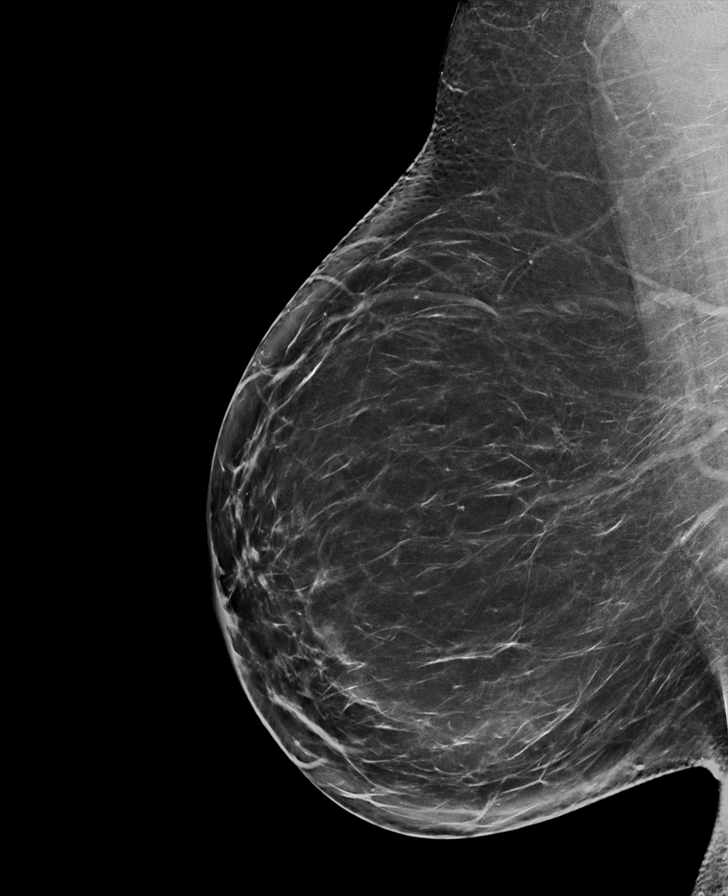

[L MLO synth-2D]
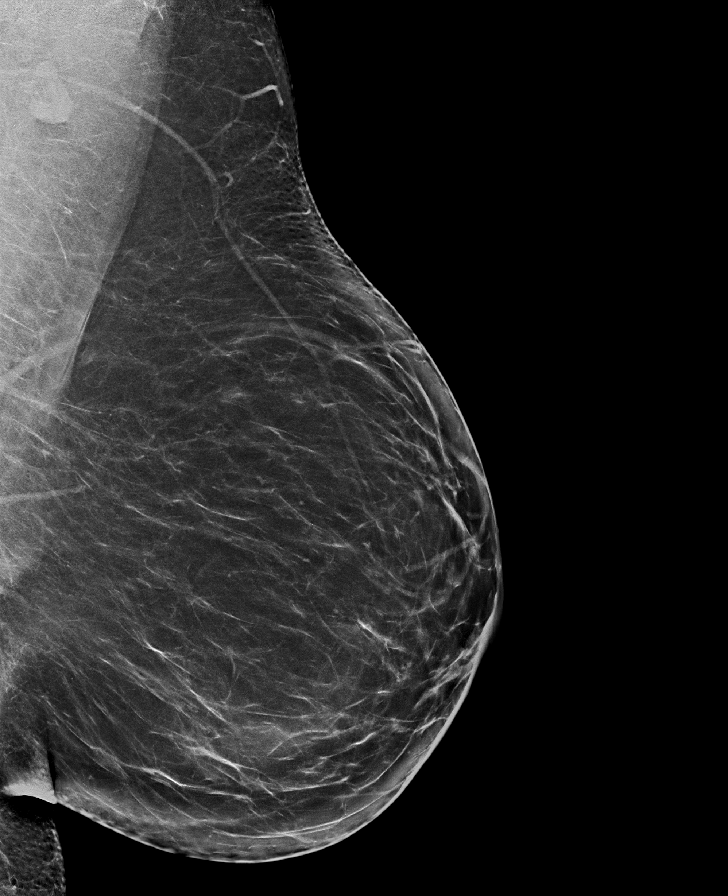

[R CC synth-2D]
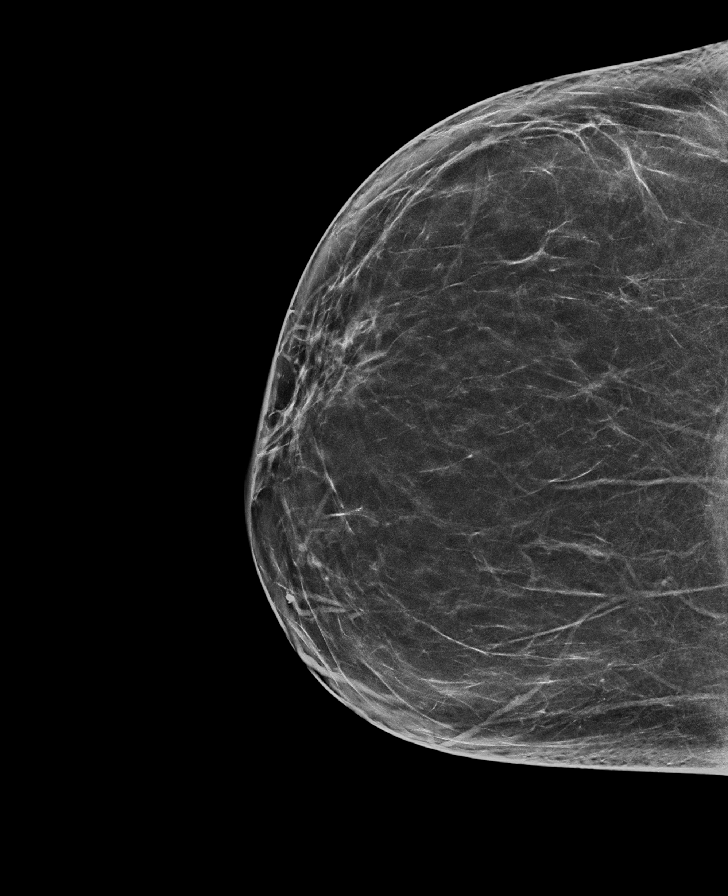

[L CC synth-2D]
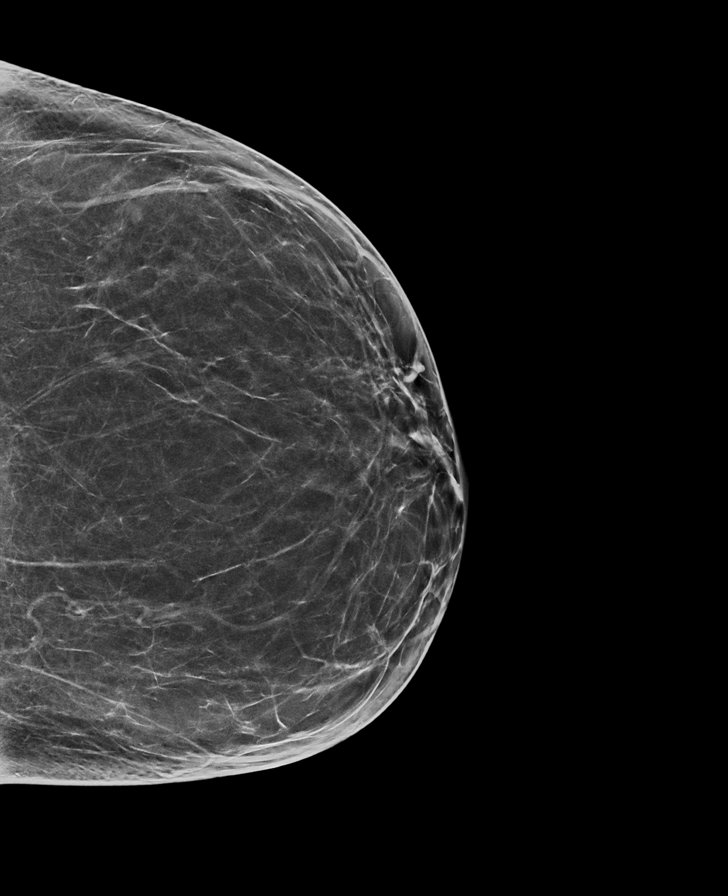

[R MLO tomo · tomo slice 45/88.0]
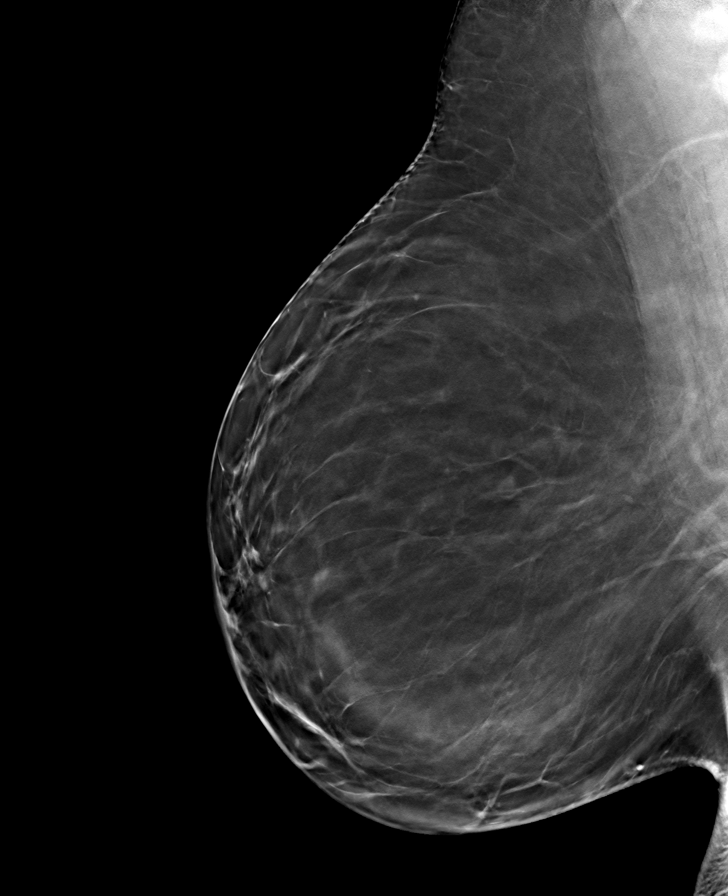

[L MLO tomo · tomo slice 44/87.0]
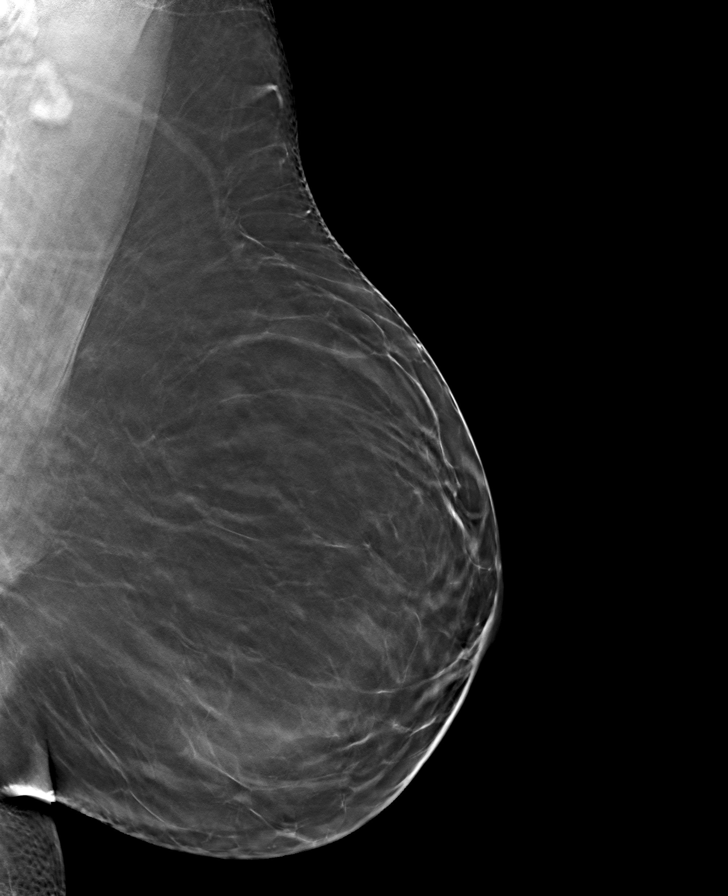

[L CC tomo · tomo slice 38/75.0]
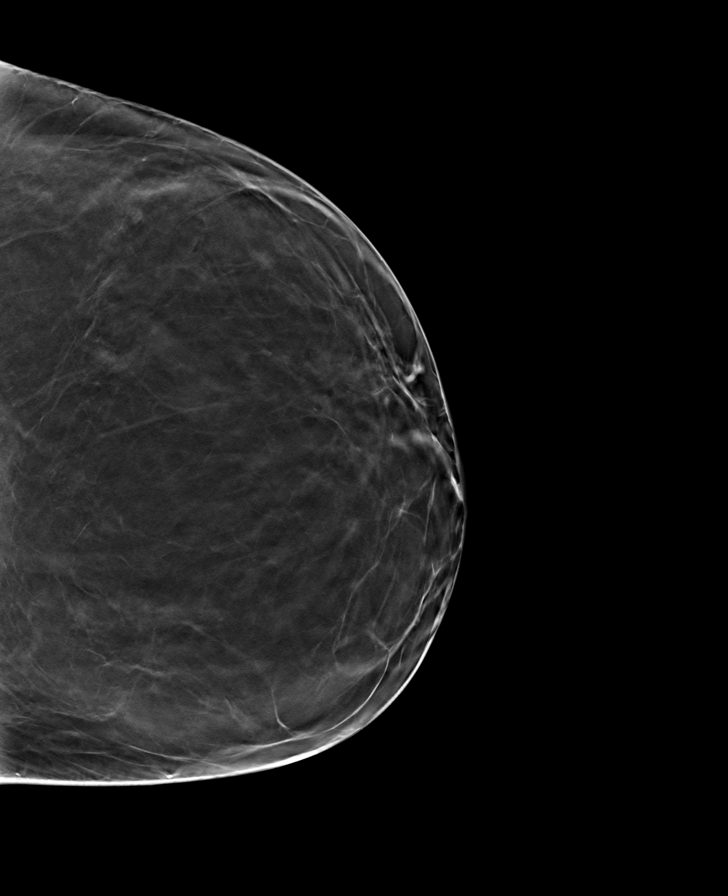

[R CC tomo · tomo slice 40/79.0]
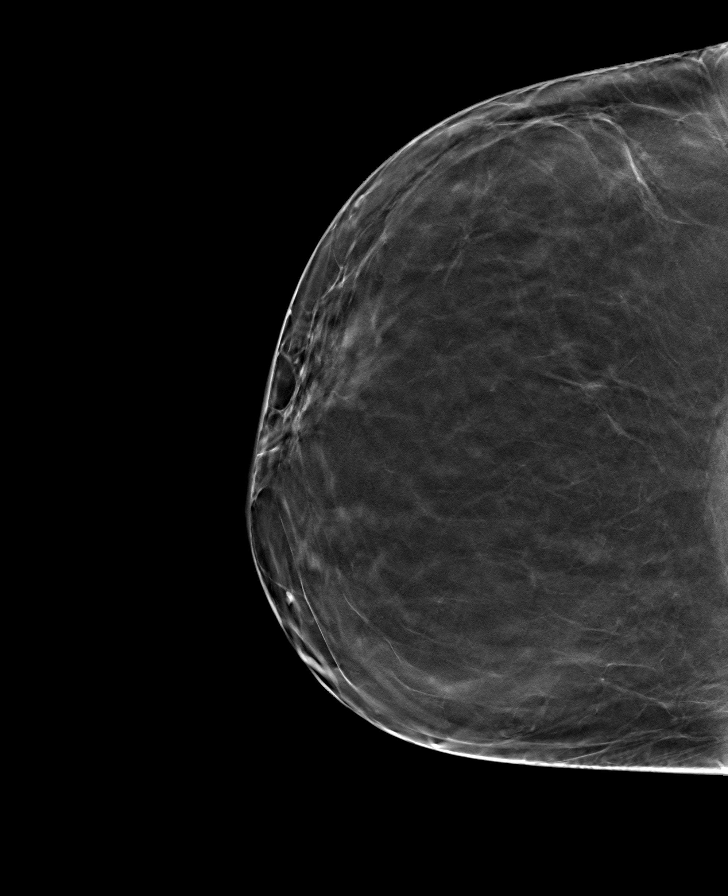

[8 of 24 positions shown; findings below may reference images not displayed]

FINDINGS: There are no findings suspicious for malignancy. Images were
processed with CAD.
IMPRESSION: No mammographic evidence of malignancy. A result letter of this
screening mammogram will be mailed directly to the patient.

RECOMMENDATION:
Screening mammogram in one year. (Code:[id number])

BI-RADS CATEGORY  1: Negative.

## 2022-05-07 DIAGNOSIS — J0100 Acute maxillary sinusitis, unspecified: Secondary | ICD-10-CM | POA: Diagnosis not present

## 2022-05-08 ENCOUNTER — Telehealth: Payer: Self-pay | Admitting: Urgent Care

## 2022-05-08 DIAGNOSIS — J069 Acute upper respiratory infection, unspecified: Secondary | ICD-10-CM

## 2022-05-09 MED ORDER — AMOXICILLIN-POT CLAVULANATE 875-125 MG PO TABS: 1.0000 | ORAL_TABLET | Freq: Two times a day (BID) | ORAL | 0 refills | Status: DC

## 2022-05-09 MED ORDER — BENZONATATE 100 MG PO CAPS: 100.0000 mg | ORAL_CAPSULE | Freq: Three times a day (TID) | ORAL | 0 refills | Status: DC | PRN

## 2022-05-09 MED ORDER — IPRATROPIUM BROMIDE 0.03 % NA SOLN: 2.0000 | Freq: Two times a day (BID) | NASAL | 0 refills | Status: DC

## 2022-05-09 NOTE — Progress Notes (Signed)
E-Visit for Upper Respiratory Infection   We are sorry you are not feeling well.  Here is how we plan to help!  Based on what you have shared with me, it looks like you may have a viral upper respiratory infection.  Upper respiratory infections are caused by a large number of viruses; however, rhinovirus is the most common cause. WE RECENTLY HAVE SEEN AN INCREASE IN INFLUENZA AND RSV which may be the cause of your current fever and aches. If your symptoms worsen throughout today, consider an in person evaluation to obtain proper influenza testing.  Symptoms vary from person to person, with common symptoms including sore throat, cough, fatigue or lack of energy and feeling of general discomfort.  A low-grade fever of up to 100.4 may present, but is often uncommon.  Symptoms vary however, and are closely related to a person's age or underlying illnesses.  The most common symptoms associated with an upper respiratory infection are nasal discharge or congestion, cough, sneezing, headache and pressure in the ears and face.  These symptoms usually persist for about 3 to 10 days, but can last up to 2 weeks.  It is important to know that upper respiratory infections do not cause serious illness or complications in most cases.    Upper respiratory infections can be transmitted from person to person, with the most common method of transmission being a person's hands.  The virus is able to live on the skin and can infect other persons for up to 2 hours after direct contact.  Also, these can be transmitted when someone coughs or sneezes; thus, it is important to cover the mouth to reduce this risk.  To keep the spread of the illness at bay, good hand hygiene is very important.  This is an infection that is most likely caused by a virus. There are no specific treatments other than to help you with the symptoms until the infection runs its course.  We are sorry you are not feeling well.  Here is how we plan to  help!   For nasal congestion, you may use an oral decongestants such as Mucinex D or if you have glaucoma or high blood pressure use plain Mucinex.  Saline nasal spray or nasal drops can help and can safely be used as often as needed for congestion.  For your congestion, I have prescribed Ipratropium Bromide nasal spray 0.03% two sprays in each nostril 2-3 times a day  If you do not have a history of heart disease, hypertension, diabetes or thyroid disease, prostate/bladder issues or glaucoma, you may also use Sudafed to treat nasal congestion.  It is highly recommended that you consult with a pharmacist or your primary care physician to ensure this medication is safe for you to take.     If you have a cough, you may use cough suppressants such as Delsym and Robitussin.  If you have glaucoma or high blood pressure, you can also use Coricidin HBP.   For cough I have prescribed for you A prescription cough medication called Tessalon Perles 100 mg. You may take 1-2 capsules every 8 hours as needed for cough  If you have a sore or scratchy throat, use a saltwater gargle-  to  teaspoon of salt dissolved in a 4-ounce to 8-ounce glass of warm water.  Gargle the solution for approximately 15-30 seconds and then spit.  It is important not to swallow the solution.  You can also use throat lozenges/cough drops and Chloraseptic spray to  help with throat pain or discomfort.  Warm or cold liquids can also be helpful in relieving throat pain.  For headache, pain or general discomfort, you can use Ibuprofen or Tylenol as directed.   Some authorities believe that zinc sprays or the use of Echinacea may shorten the course of your symptoms.   HOME CARE Only take medications as instructed by your medical team. Be sure to drink plenty of fluids. Water is fine as well as fruit juices, sodas and electrolyte beverages. You may want to stay away from caffeine or alcohol. If you are nauseated, try taking small sips of  liquids. How do you know if you are getting enough fluid? Your urine should be a pale yellow or almost colorless. Get rest. Taking a steamy shower or using a humidifier may help nasal congestion and ease sore throat pain. You can place a towel over your head and breathe in the steam from hot water coming from a faucet. Using a saline nasal spray works much the same way. Cough drops, hard candies and sore throat lozenges may ease your cough. Avoid close contacts especially the very young and the elderly Cover your mouth if you cough or sneeze Always remember to wash your hands.   GET HELP RIGHT AWAY IF: You develop worsening fever. If your symptoms do not improve within 10 days You develop yellow or green discharge from your nose over 3 days. You have coughing fits You develop a severe head ache or visual changes. You develop shortness of breath, difficulty breathing or start having chest pain Your symptoms persist after you have completed your treatment plan  MAKE SURE YOU  Understand these instructions. Will watch your condition. Will get help right away if you are not doing well or get worse.  Thank you for choosing an e-visit.  Your e-visit answers were reviewed by a board certified advanced clinical practitioner to complete your personal care plan. Depending upon the condition, your plan could have included both over the counter or prescription medications.  Please review your pharmacy choice. Make sure the pharmacy is open so you can pick up prescription now. If there is a problem, you may contact your provider through [employer] and have the prescription routed to another pharmacy.  Your safety is important to [place]. If you have drug allergies check your prescription carefully.   For the next 24 hours you can use MyChart to ask questions about today's visit, request a non-urgent call back, or ask for a work or school excuse. You will get an email in the next two days asking  about your experience. I hope that your e-visit has been valuable and will speed your recovery.    I have spent 5 minutes in review of e-visit questionnaire, review and updating patient chart, medical decision making and response to patient.   Kateryna Grantham L Mendell Bontempo, PA

## 2022-05-09 NOTE — Addendum Note (Signed)
Addended by: Guy Sandifer on: 05/09/2022 10:15 AM   Modules accepted: Orders

## 2022-08-28 ENCOUNTER — Encounter: Payer: Self-pay | Admitting: Family Medicine

## 2022-08-28 ENCOUNTER — Ambulatory Visit: Payer: 59 | Admitting: Family Medicine

## 2022-08-28 VITALS — BP 92/60 | HR 98 | Temp 98.0°F | Ht 65.0 in | Wt 220.4 lb

## 2022-08-28 DIAGNOSIS — N926 Irregular menstruation, unspecified: Secondary | ICD-10-CM | POA: Insufficient documentation

## 2022-08-28 LAB — PREGNANCY, URINE: Preg Test, Ur: NEGATIVE

## 2022-08-28 NOTE — Assessment & Plan Note (Addendum)
Acute, recurrent. Will check CBC, FSH, LH, TSH, ferritin, and estradiol levels. Will check to ensure hormone and blood levels remain within normal range, awaiting results. Recommend [place] if symptoms persist to rule out fibroids/polyps.

## 2022-08-28 NOTE — Progress Notes (Signed)
BP 92/60   Pulse 98   Temp 98 F (36.7 C)   Ht 5\' 5"  (1.651 m)   Wt 220 lb 6.4 oz (100 kg)   SpO2 98%   BMI 36.68 kg/m    Subjective:    Patient ID: Candace Reed, female    DOB: 1976/02/22, 47 y.o.   MRN: [id number]  HPI: Candace Reed is a 47 y.o. female  Chief Complaint  Patient presents with   abnormal menstrual    Since 3/28 abnormal menstrual consistent everyday, spotting last weekend, heavy for a week,lighter then now heavier flow, uses menstrual cup and pads   ABNORMAL MENSTRUAL PERIODS Since her last visit in June she has missed her period x1 for one month, other months have been regularly. Her periods normally last for 5-6 days and are heavy on the first two days.   Two weeks ago her menses lasted 6-7 days with heavy flow, filled menstrual cup every 2 hours and used period underwear frequently, and then started spotting for 3 days over the weekend. Today menses is current but not as heavy.   Duration: 2 weeks Average interval between menses: 25-27 days Length of menses:  Flow: heavy Dysmenorrhea: no Intermenstrual bleeding:no Postcoital bleeding (after intercourse): no Contraception: none Menarche at age: 70 Sexual activity: In a Monogamous Relationship History of sexually transmitted diseases: no History GYN procedures: no Abnormal pap smears: no   Dyspareunia: no Vaginal discharge:no Abdominal pain: no Galactorrhea: no Hirsuitism: no Frequent bruising/mucosal bleeding: yes Double vision:no Hot flashes: no  Sleep disturbances:No Vaginal dryness:No Heat/cold intolerance: No Fatigue: No Denies blood in stool   Relevant past medical, surgical, family and social history reviewed and updated as indicated. Interim medical history since our last visit reviewed. Allergies and medications reviewed and updated.  Review of Systems  Constitutional: Negative.   Respiratory: Negative.    Cardiovascular: Negative.   Endocrine: Negative for cold  intolerance and heat intolerance.  Genitourinary:  Positive for menstrual problem and vaginal bleeding. Negative for dyspareunia, hematuria, pelvic pain, vaginal discharge and vaginal pain.    Per HPI unless specifically indicated above     Objective:    BP 92/60   Pulse 98   Temp 98 F (36.7 C)   Ht 5\' 5"  (1.651 m)   Wt 220 lb 6.4 oz (100 kg)   SpO2 98%   BMI 36.68 kg/m   Wt Readings from Last 3 Encounters:  08/28/22 220 lb 6.4 oz (100 kg)  10/30/21 219 lb 12.8 oz (99.7 kg)  05/05/21 207 lb 12.8 oz (94.3 kg)    Physical Exam Vitals and nursing note reviewed.  Constitutional:      General: She is awake. She is not in acute distress.    Appearance: Normal appearance. She is well-developed and well-groomed. She is not ill-appearing.  HENT:     Head: Normocephalic and atraumatic.     Right Ear: Hearing and external ear normal. No drainage.     Left Ear: Hearing and external ear normal. No drainage.     Nose: Nose normal.  Eyes:     General: Lids are normal.        Right eye: No discharge.        Left eye: No discharge.     Conjunctiva/sclera: Conjunctivae normal.  Cardiovascular:     Rate and Rhythm: Normal rate and regular rhythm.     Heart sounds: Normal heart sounds, S1 normal and S2 normal. No murmur heard.    No  gallop.  Pulmonary:     Effort: Pulmonary effort is normal. No accessory muscle usage or respiratory distress.     Breath sounds: Normal breath sounds.  Musculoskeletal:        General: Normal range of motion.     Cervical back: Full passive range of motion without pain and normal range of motion.     Right lower leg: No edema.     Left lower leg: No edema.  Skin:    General: Skin is warm and dry.     Capillary Refill: Capillary refill takes less than 2 seconds.  Neurological:     Mental Status: She is alert and oriented to person, place, and time.  Psychiatric:        Attention and Perception: Attention normal.        Mood and Affect: Mood normal.         Speech: Speech normal.        Behavior: Behavior normal. Behavior is cooperative.        Thought Content: Thought content normal.     Results for orders placed or performed in visit on 08/28/22  Pregnancy, urine (STAT)  Result Value Ref Range   Preg Test, Ur Negative Negative      Assessment & Plan:   Problem List Items Addressed This Visit       Other   Abnormal menstrual periods - Primary    Acute, recurrent. Will check CBC, FSH, LH, TSH, ferritin, and estradiol levels. Will check to ensure hormone and blood levels remain within normal range, awaiting results. Recommend [place] if symptoms persist to rule out fibroids/polyps.      Relevant Orders   Pregnancy, urine (STAT) (Completed)   CBC With Diff/Platelet   Ferritin   TSH   Estradiol   FSH   LH     Follow up plan: Return in about 2 months (around 11/09/2022) for physical.

## 2022-08-28 NOTE — Patient Instructions (Addendum)
If Vasomotor symptoms present try: Evening Primrose oil Black Cohosh Estroven over the counter

## 2022-08-29 LAB — FERRITIN: Ferritin: 10 ng/mL — ABNORMAL LOW (ref 15–150)

## 2022-08-29 LAB — LUTEINIZING HORMONE: LH: 2.5 m[IU]/mL

## 2022-08-29 LAB — FOLLICLE STIMULATING HORMONE: FSH: 10.8 m[IU]/mL

## 2022-08-29 LAB — TSH: TSH: 1.43 u[IU]/mL (ref 0.450–4.500)

## 2022-08-29 LAB — ESTRADIOL: Estradiol: 34.7 pg/mL

## 2022-08-31 NOTE — Addendum Note (Signed)
Addended by: Prescott Gum on: 08/31/2022 03:38 PM   Modules accepted: Orders

## 2022-09-01 ENCOUNTER — Other Ambulatory Visit: Payer: 59

## 2022-09-01 DIAGNOSIS — N926 Irregular menstruation, unspecified: Secondary | ICD-10-CM | POA: Diagnosis not present

## 2022-09-02 LAB — CBC WITH DIFF/PLATELET
Basophils Absolute: 0 10*3/uL (ref 0.0–0.2)
Basos: 1 %
EOS (ABSOLUTE): 0.2 10*3/uL (ref 0.0–0.4)
Eos: 4 %
Hematocrit: 34.3 % (ref 34.0–46.6)
Hemoglobin: 10.9 g/dL — ABNORMAL LOW (ref 11.1–15.9)
Immature Grans (Abs): 0 10*3/uL (ref 0.0–0.1)
Immature Granulocytes: 0 %
Lymphocytes Absolute: 1.8 10*3/uL (ref 0.7–3.1)
Lymphs: 37 %
MCH: 27.5 pg (ref 26.6–33.0)
MCHC: 31.8 g/dL (ref 31.5–35.7)
MCV: 87 fL (ref 79–97)
Monocytes Absolute: 0.6 10*3/uL (ref 0.1–0.9)
Monocytes: 12 %
Neutrophils Absolute: 2.3 10*3/uL (ref 1.4–7.0)
Neutrophils: 46 %
Platelets: 427 10*3/uL (ref 150–450)
RBC: 3.96 x10E6/uL (ref 3.77–5.28)
RDW: 13.4 % (ref 11.7–15.4)
WBC: 4.9 10*3/uL (ref 3.4–10.8)

## 2022-09-03 MED ORDER — IRON (FERROUS SULFATE) 325 (65 FE) MG PO TABS: 325.0000 mg | ORAL_TABLET | Freq: Every day | ORAL | 0 refills | Status: DC

## 2022-09-29 ENCOUNTER — Ambulatory Visit: Payer: 59 | Admitting: Family Medicine

## 2022-11-09 ENCOUNTER — Encounter: Payer: Self-pay | Admitting: Family Medicine

## 2022-11-09 ENCOUNTER — Other Ambulatory Visit: Payer: Self-pay

## 2022-11-09 ENCOUNTER — Telehealth: Payer: Self-pay

## 2022-11-09 ENCOUNTER — Ambulatory Visit (INDEPENDENT_AMBULATORY_CARE_PROVIDER_SITE_OTHER): Payer: 59 | Admitting: Family Medicine

## 2022-11-09 VITALS — BP 114/82 | HR 71 | Temp 98.0°F | Ht 65.35 in | Wt 219.6 lb

## 2022-11-09 DIAGNOSIS — Z1211 Encounter for screening for malignant neoplasm of colon: Secondary | ICD-10-CM

## 2022-11-09 DIAGNOSIS — Z0000 Encounter for general adult medical examination without abnormal findings: Secondary | ICD-10-CM

## 2022-11-09 DIAGNOSIS — Z1231 Encounter for screening mammogram for malignant neoplasm of breast: Secondary | ICD-10-CM

## 2022-11-09 LAB — URINALYSIS, ROUTINE W REFLEX MICROSCOPIC
Bilirubin, UA: NEGATIVE
Glucose, UA: NEGATIVE
Ketones, UA: NEGATIVE
Leukocytes,UA: NEGATIVE
Nitrite, UA: NEGATIVE
Protein,UA: NEGATIVE
RBC, UA: NEGATIVE
Specific Gravity, UA: 1.015 (ref 1.005–1.030)
Urobilinogen, Ur: 0.2 mg/dL (ref 0.2–1.0)
pH, UA: 5.5 (ref 5.0–7.5)

## 2022-11-09 MED ORDER — MONTELUKAST SODIUM 10 MG PO TABS: ORAL_TABLET | ORAL | 3 refills | Status: DC

## 2022-11-09 MED ORDER — NA SULFATE-K SULFATE-MG SULF 17.5-3.13-1.6 GM/177ML PO SOLN: 1.0000 | Freq: Once | ORAL | 0 refills | Status: AC

## 2022-11-09 MED ORDER — OMEPRAZOLE 20 MG PO CPDR: 20.0000 mg | DELAYED_RELEASE_CAPSULE | Freq: Every day | ORAL | 3 refills | Status: DC

## 2022-11-09 MED ORDER — FLUTICASONE PROPIONATE 50 MCG/ACT NA SUSP: 2.0000 | Freq: Two times a day (BID) | NASAL | 12 refills | Status: DC

## 2022-11-09 MED ORDER — IRON (FERROUS SULFATE) 325 (65 FE) MG PO TABS: 325.0000 mg | ORAL_TABLET | Freq: Every day | ORAL | 3 refills | Status: DC

## 2022-11-09 NOTE — Patient Instructions (Signed)
Please call to schedule your mammogram and/or bone density: [facility]  Address: [address], [place], [place] [address] Phone: [phone]  [facility] [place] [address] [place],  [place]  [address] Phone: [phone]   

## 2022-11-09 NOTE — Progress Notes (Signed)
BP 114/82   Pulse 71   Temp 98 F (36.7 C) (Oral)   Ht 5' 5.35" (1.66 m)   Wt 219 lb 9.6 oz (99.6 kg)   LMP 10/05/2022 (Approximate)   SpO2 98%   BMI 36.15 kg/m    Subjective:    Patient ID: Candace Reed, female    DOB: 1975-10-02, 47 y.o.   MRN: [id number]  HPI: Candace Reed is a 47 y.o. female presenting on 11/09/2022 for comprehensive medical examination. Current medical complaints include:  Having some pain in her elbow from planting in the garden. Having some increased heartburn. Otherwise feeling well.    She currently lives with: husband and sons Menopausal Symptoms: no  Depression Screen done today and results listed below:     11/09/2022    9:29 AM 08/28/2022    8:55 AM 08/28/2022    8:50 AM 10/30/2021    9:34 AM 10/15/2020    8:13 AM  Depression screen PHQ 2/9  Decreased Interest 0 0 0 0 0  Down, Depressed, Hopeless 0 0 0 0 0  PHQ - 2 Score 0 0 0 0 0  Altered sleeping 0 0 0 0   Tired, decreased energy 0 0 0 0   Change in appetite 0 0 0 2   Feeling bad or failure about yourself  0 0 0 0   Trouble concentrating 0 0 0 0   Moving slowly or fidgety/restless 0 0 0 0   Suicidal thoughts 0 0 0 0   PHQ-9 Score 0 0 0 2   Difficult doing work/chores Not difficult at all  Not difficult at all Not difficult at all     Past Medical History:  Past Medical History:  Diagnosis Date   Allergy     Surgical History:  History reviewed. No pertinent surgical history.  Medications:  No current outpatient medications on file prior to visit.   No current facility-administered medications on file prior to visit.    Allergies:  No Known Allergies  Social History:  Social History   Socioeconomic History   Marital status: Married    Spouse name: Not on file   Number of children: Not on file   Years of education: Not on file   Highest education level: Not on file  Occupational History   Not on file  Tobacco Use   Smoking status: Former    Types: Cigarettes     Quit date: 05/19/2003    Years since quitting: 19.4   Smokeless tobacco: Never  Vaping Use   Vaping Use: Never used  Substance and Sexual Activity   Alcohol use: Yes    Alcohol/week: 4.0 standard drinks of alcohol    Types: 2 Glasses of wine, 2 Cans of beer per week   Drug use: Never   Sexual activity: Yes    Birth control/protection: None  Other Topics Concern   Not on file  Social History Narrative   Not on file   Social Determinants of Health   Financial Resource Strain: Not on file  Food Insecurity: Not on file  Transportation Needs: Not on file  Physical Activity: Not on file  Stress: Not on file  Social Connections: Not on file  Intimate Partner Violence: Not on file   Social History   Tobacco Use  Smoking Status Former   Types: Cigarettes   Quit date: 05/19/2003   Years since quitting: 19.4  Smokeless Tobacco Never   Social History   Substance and Sexual Activity  Alcohol Use Yes   Alcohol/week: 4.0 standard drinks of alcohol   Types: 2 Glasses of wine, 2 Cans of beer per week    Family History:  Family History  Problem Relation Age of Onset   Cancer Mother        Thyroid   Allergies Son    Birth defects Maternal Grandmother    Birth defects Maternal Grandfather    Breast cancer Neg Hx     Past medical history, surgical history, medications, allergies, family history and social history reviewed with patient today and changes made to appropriate areas of the chart.   Review of Systems  Constitutional: Negative.   HENT: Negative.    Eyes: Negative.   Respiratory: Negative.    Cardiovascular: Negative.   Gastrointestinal:  Positive for heartburn. Negative for abdominal pain, blood in stool, constipation, diarrhea, melena, nausea and vomiting.  Genitourinary: Negative.   Musculoskeletal:  Positive for joint pain. Negative for back pain, falls, myalgias and neck pain.  Skin: Negative.   Neurological: Negative.   Endo/Heme/Allergies:  Positive for  environmental allergies. Negative for polydipsia. Does not bruise/bleed easily.  Psychiatric/Behavioral: Negative.     All other ROS negative except what is listed above and in the HPI.      Objective:    BP 114/82   Pulse 71   Temp 98 F (36.7 C) (Oral)   Ht 5' 5.35" (1.66 m)   Wt 219 lb 9.6 oz (99.6 kg)   LMP 10/05/2022 (Approximate)   SpO2 98%   BMI 36.15 kg/m   Wt Readings from Last 3 Encounters:  11/09/22 219 lb 9.6 oz (99.6 kg)  08/28/22 220 lb 6.4 oz (100 kg)  10/30/21 219 lb 12.8 oz (99.7 kg)    Physical Exam Vitals and nursing note reviewed.  Constitutional:      General: She is not in acute distress.    Appearance: Normal appearance. She is not ill-appearing, toxic-appearing or diaphoretic.  HENT:     Head: Normocephalic and atraumatic.     Right Ear: Tympanic membrane, ear canal and external ear normal. There is no impacted cerumen.     Left Ear: Tympanic membrane, ear canal and external ear normal. There is no impacted cerumen.     Nose: Nose normal. No congestion or rhinorrhea.     Mouth/Throat:     Mouth: Mucous membranes are moist.     Pharynx: Oropharynx is clear. No oropharyngeal exudate or posterior oropharyngeal erythema.  Eyes:     General: No scleral icterus.       Right eye: No discharge.        Left eye: No discharge.     Extraocular Movements: Extraocular movements intact.     Conjunctiva/sclera: Conjunctivae normal.     Pupils: Pupils are equal, round, and reactive to light.  Neck:     Vascular: No carotid bruit.  Cardiovascular:     Rate and Rhythm: Normal rate and regular rhythm.     Pulses: Normal pulses.     Heart sounds: No murmur heard.    No friction rub. No gallop.  Pulmonary:     Effort: Pulmonary effort is normal. No respiratory distress.     Breath sounds: Normal breath sounds. No stridor. No wheezing, rhonchi or rales.  Chest:     Chest wall: No tenderness.  Abdominal:     General: Abdomen is flat. Bowel sounds are normal.  There is no distension.     Palpations: Abdomen is soft. There is  no mass.     Tenderness: There is no abdominal tenderness. There is no right CVA tenderness, left CVA tenderness, guarding or rebound.     Hernia: No hernia is present.  Genitourinary:    Comments: Breast and pelvic exams deferred with shared decision making Musculoskeletal:        General: No swelling, tenderness, deformity or signs of injury.     Cervical back: Normal range of motion and neck supple. No rigidity. No muscular tenderness.     Right lower leg: No edema.     Left lower leg: No edema.  Lymphadenopathy:     Cervical: No cervical adenopathy.  Skin:    General: Skin is warm and dry.     Capillary Refill: Capillary refill takes less than 2 seconds.     Coloration: Skin is not jaundiced or pale.     Findings: No bruising, erythema, lesion or rash.  Neurological:     General: No focal deficit present.     Mental Status: She is alert and oriented to person, place, and time. Mental status is at baseline.     Cranial Nerves: No cranial nerve deficit.     Sensory: No sensory deficit.     Motor: No weakness.     Coordination: Coordination normal.     Gait: Gait normal.     Deep Tendon Reflexes: Reflexes normal.  Psychiatric:        Mood and Affect: Mood normal.        Behavior: Behavior normal.        Thought Content: Thought content normal.        Judgment: Judgment normal.     Results for orders placed or performed in visit on 09/01/22  CBC With Diff/Platelet  Result Value Ref Range   WBC 4.9 3.4 - 10.8 x10E3/uL   RBC 3.96 3.77 - 5.28 x10E6/uL   Hemoglobin 10.9 (L) 11.1 - 15.9 g/dL   Hematocrit 78.2 95.6 - 46.6 %   MCV 87 79 - 97 fL   MCH 27.5 26.6 - 33.0 pg   MCHC 31.8 31.5 - 35.7 g/dL   RDW 21.3 08.6 - 57.8 %   Platelets 427 150 - 450 x10E3/uL   Neutrophils 46 Not Estab. %   Lymphs 37 Not Estab. %   Monocytes 12 Not Estab. %   Eos 4 Not Estab. %   Basos 1 Not Estab. %   Neutrophils Absolute  2.3 1.4 - 7.0 x10E3/uL   Lymphocytes Absolute 1.8 0.7 - 3.1 x10E3/uL   Monocytes Absolute 0.6 0.1 - 0.9 x10E3/uL   EOS (ABSOLUTE) 0.2 0.0 - 0.4 x10E3/uL   Basophils Absolute 0.0 0.0 - 0.2 x10E3/uL   Immature Granulocytes 0 Not Estab. %   Immature Grans (Abs) 0.0 0.0 - 0.1 x10E3/uL      Assessment & Plan:   Problem List Items Addressed This Visit   None Visit Diagnoses     Routine general medical examination at a health care facility    -  Primary   Vaccines up to date/declined. Screening labs checked today. Pap up to date. Mammo and colonoscopy ordered. Continue diet and exercise. Call with any concerns.   Relevant Orders   CBC with Differential/Platelet   Comprehensive metabolic panel   Lipid Panel w/o Chol/HDL Ratio   Urinalysis, Routine w reflex microscopic   TSH   Encounter for screening mammogram for malignant neoplasm of breast       Mammogram ordered today   Relevant Orders  MM 3D SCREENING MAMMOGRAM BILATERAL BREAST   Screening for colon cancer       Referral to GI placed today.   Relevant Orders   Ambulatory referral to Gastroenterology        Follow up plan: Return in about 1 year (around 11/09/2023) for physical .   LABORATORY TESTING:  - Pap smear: up to date  IMMUNIZATIONS:   - Tdap: Tetanus vaccination status reviewed: last tetanus booster within 10 years. - Influenza: Postponed to flu season - Pneumovax: Not applicable - Prevnar: Not applicable - COVID: Refused - HPV: Not applicable - Shingrix vaccine: Not applicable  SCREENING: -Mammogram: Ordered today  - Colonoscopy: Done elsewhere   PATIENT COUNSELING:   Advised to take 1 mg of folate supplement per day if capable of pregnancy.   Sexuality: Discussed sexually transmitted diseases, partner selection, use of condoms, avoidance of unintended pregnancy  and contraceptive alternatives.   Advised to avoid cigarette smoking.  I discussed with the patient that most people either abstain from  alcohol or drink within safe limits (<=14/week and <=4 drinks/occasion for males, <=7/weeks and <= 3 drinks/occasion for females) and that the risk for alcohol disorders and other health effects rises proportionally with the number of drinks per week and how often a drinker exceeds daily limits.  Discussed cessation/primary prevention of drug use and availability of treatment for abuse.   Diet: Encouraged to adjust caloric intake to maintain  or achieve ideal body weight, to reduce intake of dietary saturated fat and total fat, to limit sodium intake by avoiding high sodium foods and not adding table salt, and to maintain adequate dietary potassium and calcium preferably from fresh fruits, vegetables, and low-fat dairy products.    stressed the importance of regular exercise  Injury prevention: Discussed safety belts, safety helmets, smoke detector, smoking near bedding or upholstery.   Dental health: Discussed importance of regular tooth brushing, flossing, and dental visits.    NEXT PREVENTATIVE PHYSICAL DUE IN 1 YEAR. Return in about 1 year (around 11/09/2023) for physical .

## 2022-11-09 NOTE — Telephone Encounter (Signed)
Correction: Family history of colon cancer-grandmother.  Thanks,  [place], [place]

## 2022-11-09 NOTE — Telephone Encounter (Signed)
Gastroenterology Pre-Procedure Review  Request Date: 12/24/22 Requesting Physician: Dr. Servando Snare  PATIENT REVIEW QUESTIONS: The patient responded to the following health history questions as indicated:    1. Are you having any GI issues? no 2. Do you have a personal history of Polyps? no 3. Do you have a family history of Colon Cancer or Polyps? no 4. Diabetes Mellitus? no 5. Joint replacements in the past 12 months?no 6. Major health problems in the past 3 months?no 7. Any artificial heart valves, MVP, or defibrillator?no    MEDICATIONS & ALLERGIES:    Patient reports the following regarding taking any anticoagulation/antiplatelet therapy:   Plavix, Coumadin, Eliquis, Xarelto, Lovenox, Pradaxa, Brilinta, or Effient? no Aspirin? no  Patient confirms/reports the following medications:  Current Outpatient Medications  Medication Sig Dispense Refill   fluticasone (FLONASE) 50 MCG/ACT nasal spray Place 2 sprays into both nostrils 2 (two) times daily. 16 g 12   Iron, Ferrous Sulfate, 325 (65 Fe) MG TABS Take 325 mg by mouth daily. 90 tablet 3   montelukast (SINGULAIR) 10 MG tablet TAKE 1 TABLET BY MOUTH EVERYDAY AT BEDTIME 90 tablet 3   omeprazole (PRILOSEC) 20 MG capsule Take 1 capsule (20 mg total) by mouth daily. 90 capsule 3   No current facility-administered medications for this visit.    Patient confirms/reports the following allergies:  No Known Allergies  No orders of the defined types were placed in this encounter.   AUTHORIZATION INFORMATION Primary Insurance: 1D#: Group #:  Secondary Insurance: 1D#: Group #:  SCHEDULE INFORMATION: Date: 12/24/22 Time: Location: [facility]

## 2022-11-10 LAB — COMPREHENSIVE METABOLIC PANEL
ALT: 11 IU/L (ref 0–32)
AST: 13 IU/L (ref 0–40)
Albumin: 4 g/dL (ref 3.9–4.9)
Alkaline Phosphatase: 57 IU/L (ref 44–121)
BUN/Creatinine Ratio: 15 (ref 9–23)
BUN: 14 mg/dL (ref 6–24)
Bilirubin Total: 0.4 mg/dL (ref 0.0–1.2)
CO2: 19 mmol/L — ABNORMAL LOW (ref 20–29)
Calcium: 9.2 mg/dL (ref 8.7–10.2)
Chloride: 103 mmol/L (ref 96–106)
Creatinine, Ser: 0.95 mg/dL (ref 0.57–1.00)
Globulin, Total: 2.5 g/dL (ref 1.5–4.5)
Glucose: 91 mg/dL (ref 70–99)
Potassium: 4.5 mmol/L (ref 3.5–5.2)
Sodium: 138 mmol/L (ref 134–144)
Total Protein: 6.5 g/dL (ref 6.0–8.5)
eGFR: 75 mL/min/{1.73_m2} (ref >59)

## 2022-11-10 LAB — CBC WITH DIFFERENTIAL/PLATELET
Basophils Absolute: 0 10*3/uL (ref 0.0–0.2)
Basos: 1 %
EOS (ABSOLUTE): 0.1 10*3/uL (ref 0.0–0.4)
Eos: 3 %
Hematocrit: 36.6 % (ref 34.0–46.6)
Hemoglobin: 11.8 g/dL (ref 11.1–15.9)
Immature Grans (Abs): 0 10*3/uL (ref 0.0–0.1)
Immature Granulocytes: 0 %
Lymphocytes Absolute: 2.2 10*3/uL (ref 0.7–3.1)
Lymphs: 40 %
MCH: 27.4 pg (ref 26.6–33.0)
MCHC: 32.2 g/dL (ref 31.5–35.7)
MCV: 85 fL (ref 79–97)
Monocytes Absolute: 0.6 10*3/uL (ref 0.1–0.9)
Monocytes: 11 %
Neutrophils Absolute: 2.5 10*3/uL (ref 1.4–7.0)
Neutrophils: 45 %
Platelets: 393 10*3/uL (ref 150–450)
RBC: 4.31 x10E6/uL (ref 3.77–5.28)
RDW: 14.7 % (ref 11.7–15.4)
WBC: 5.5 10*3/uL (ref 3.4–10.8)

## 2022-11-10 LAB — LIPID PANEL W/O CHOL/HDL RATIO
Cholesterol, Total: 159 mg/dL (ref 100–199)
HDL: 59 mg/dL (ref >39)
LDL Chol Calc (NIH): 83 mg/dL (ref 0–99)
Triglycerides: 93 mg/dL (ref 0–149)
VLDL Cholesterol Cal: 17 mg/dL (ref 5–40)

## 2022-11-10 LAB — TSH: TSH: 1.27 u[IU]/mL (ref 0.450–4.500)

## 2022-12-18 ENCOUNTER — Encounter: Payer: Self-pay | Admitting: Gastroenterology

## 2022-12-24 ENCOUNTER — Ambulatory Visit: Payer: Self-pay | Admitting: Anesthesiology

## 2022-12-24 ENCOUNTER — Encounter: Payer: Self-pay | Admitting: Gastroenterology

## 2022-12-24 ENCOUNTER — Ambulatory Visit: Payer: 59 | Admitting: Anesthesiology

## 2022-12-24 ENCOUNTER — Encounter
Admission: RE | Disposition: A | Discharge status: Home / Self Care | Payer: Self-pay | Source: Home / Self Care | Attending: Gastroenterology

## 2022-12-24 ENCOUNTER — Ambulatory Visit
Admission: RE | Admit: 2022-12-24 | Discharge: 2022-12-24 | Disposition: A | Discharge status: Home / Self Care | Payer: 59 | Attending: Gastroenterology | Admitting: Gastroenterology

## 2022-12-24 ENCOUNTER — Other Ambulatory Visit: Payer: Self-pay

## 2022-12-24 DIAGNOSIS — K640 First degree hemorrhoids: Secondary | ICD-10-CM | POA: Diagnosis not present

## 2022-12-24 DIAGNOSIS — Z87891 Personal history of nicotine dependence: Secondary | ICD-10-CM | POA: Diagnosis not present

## 2022-12-24 DIAGNOSIS — Z1211 Encounter for screening for malignant neoplasm of colon: Secondary | ICD-10-CM | POA: Diagnosis not present

## 2022-12-24 DIAGNOSIS — S92413A Displaced fracture of proximal phalanx of unspecified great toe, initial encounter for closed fracture: Secondary | ICD-10-CM

## 2022-12-24 HISTORY — DX: Gastro-esophageal reflux disease without esophagitis: K21.9

## 2022-12-24 HISTORY — PX: COLONOSCOPY WITH PROPOFOL: SHX5780

## 2022-12-24 LAB — POCT PREGNANCY, URINE: Preg Test, Ur: NEGATIVE

## 2022-12-24 SURGERY — COLONOSCOPY WITH PROPOFOL
Anesthesia: General | Site: Rectum

## 2022-12-24 MED ORDER — LIDOCAINE HCL (CARDIAC) PF 100 MG/5ML IV SOSY
PREFILLED_SYRINGE | INTRAVENOUS | Status: DC | PRN
  Administered 2022-12-24: 100 mg via INTRAVENOUS

## 2022-12-24 MED ORDER — STERILE WATER FOR IRRIGATION IR SOLN
Status: DC | PRN
  Administered 2022-12-24: 1

## 2022-12-24 MED ORDER — LACTATED RINGERS IV SOLN: INTRAVENOUS | Status: DC

## 2022-12-24 MED ORDER — PROPOFOL 10 MG/ML IV BOLUS
INTRAVENOUS | Status: DC | PRN
Start: 2022-12-24 — End: 2022-12-24
  Administered 2022-12-24: 20 mg via INTRAVENOUS
  Administered 2022-12-24: 110 mg via INTRAVENOUS
  Administered 2022-12-24 (×2): 20 mg via INTRAVENOUS
  Administered 2022-12-24: 30 mg via INTRAVENOUS

## 2022-12-24 SURGICAL SUPPLY — 21 items

## 2022-12-24 NOTE — Anesthesia Postprocedure Evaluation (Signed)
Anesthesia Post Note  Patient: Candace Reed  Procedure(s) Performed: COLONOSCOPY WITH PROPOFOL (Rectum)  Patient location during evaluation: PACU Anesthesia Type: General Level of consciousness: awake and alert Pain management: pain level controlled Vital Signs Assessment: post-procedure vital signs reviewed and stable Respiratory status: spontaneous breathing, nonlabored ventilation, respiratory function stable and patient connected to nasal cannula oxygen Cardiovascular status: blood pressure returned to baseline and stable Postop Assessment: no apparent nausea or vomiting Anesthetic complications: no   No notable events documented.   Last Vitals:  Vitals:   12/24/22 1040 12/24/22 1041  BP:  103/74  Pulse:  72  Resp: (!) 24 13  Temp:    SpO2: 99% 100%    Last Pain:  Vitals:   12/24/22 1039  TempSrc:   PainSc: 0-No pain                 Louie Boston

## 2022-12-24 NOTE — Anesthesia Preprocedure Evaluation (Signed)
Anesthesia Evaluation  Patient identified by MRN, date of birth, ID band Patient awake    Reviewed: Allergy & Precautions, NPO status , Patient's Chart, lab work & pertinent test results  History of Anesthesia Complications Negative for: history of anesthetic complications  Airway Mallampati: II  TM Distance: >3 FB Neck ROM: full    Dental no notable dental hx.    Pulmonary neg pulmonary ROS, former smoker   Pulmonary exam normal        Cardiovascular negative cardio ROS Normal cardiovascular exam     Neuro/Psych negative neurological ROS  negative psych ROS   GI/Hepatic Neg liver ROS,GERD  Controlled and Medicated,,  Endo/Other  negative endocrine ROS    Renal/GU negative Renal ROS  negative genitourinary   Musculoskeletal   Abdominal   Peds  Hematology negative hematology ROS (+)   Anesthesia Other Findings Past Medical History: No date: Allergy No date: GERD (gastroesophageal reflux disease)  History reviewed. No pertinent surgical history.     Reproductive/Obstetrics negative OB ROS                             Anesthesia Physical Anesthesia Plan  ASA: 2  Anesthesia Plan: General   Post-op Pain Management: Minimal or no pain anticipated   Induction: Intravenous  PONV Risk Score and Plan: 1 and Propofol infusion and TIVA  Airway Management Planned: Natural Airway and Nasal Cannula  Additional Equipment:   Intra-op Plan:   Post-operative Plan:   Informed Consent: I have reviewed the patients History and Physical, chart, labs and discussed the procedure including the risks, benefits and alternatives for the proposed anesthesia with the patient or authorized representative who has indicated his/her understanding and acceptance.     Dental Advisory Given  Plan Discussed with: Anesthesiologist, CRNA and Surgeon  Anesthesia Plan Comments: (Patient consented for  risks of anesthesia including but not limited to:  - adverse reactions to medications - risk of airway placement if required - damage to eyes, teeth, lips or other oral mucosa - nerve damage due to positioning  - sore throat or hoarseness - Damage to heart, brain, nerves, lungs, other parts of body or loss of life  Patient voiced understanding.)       Anesthesia Quick Evaluation

## 2022-12-24 NOTE — Transfer of Care (Signed)
Immediate Anesthesia Transfer of Care Note  Patient: Candace Reed  Procedure(s) Performed: COLONOSCOPY WITH PROPOFOL (Rectum)  Patient Location: PACU  Anesthesia Type: General  Level of Consciousness: awake, alert  and patient cooperative  Airway and Oxygen Therapy: Patient Spontanous Breathing and Patient connected to supplemental oxygen  Post-op Assessment: Post-op Vital signs reviewed, Patient's Cardiovascular Status Stable, Respiratory Function Stable, Patent Airway and No signs of Nausea or vomiting  Post-op Vital Signs: Reviewed and stable  Complications: No notable events documented.

## 2022-12-24 NOTE — H&P (Signed)
Midge Minium, MD [facility] [address], [address] [place], [place] [address] Phone: [phone] Fax : [phone]  Primary Care Physician:  Dorcas Carrow, DO Primary Gastroenterologist:  Dr. Servando Snare  Pre-Procedure History & Physical: HPI:  Candace Reed is a 47 y.o. female is here for a screening colonoscopy.   Past Medical History:  Diagnosis Date   Allergy    GERD (gastroesophageal reflux disease)     History reviewed. No pertinent surgical history.  Prior to Admission medications   Medication Sig Start Date End Date Taking? Authorizing Provider  fluticasone (FLONASE) 50 MCG/ACT nasal spray Place 2 sprays into both nostrils 2 (two) times daily. 11/09/22  Yes Johnson, Megan P, DO  montelukast (SINGULAIR) 10 MG tablet TAKE 1 TABLET BY MOUTH EVERYDAY AT BEDTIME 11/09/22  Yes Johnson, Megan P, DO  omeprazole (PRILOSEC) 20 MG capsule Take 1 capsule (20 mg total) by mouth daily. 11/09/22  Yes Johnson, Megan P, DO  Iron, Ferrous Sulfate, 325 (65 Fe) MG TABS Take 325 mg by mouth daily. 11/09/22 12/09/22  Olevia Perches P, DO    Allergies as of 11/09/2022   (No Known Allergies)    Family History  Problem Relation Age of Onset   Cancer Mother        Thyroid   Allergies Son    Birth defects Maternal Grandmother    Birth defects Maternal Grandfather    Breast cancer Neg Hx     Social History   Socioeconomic History   Marital status: Married    Spouse name: Not on file   Number of children: Not on file   Years of education: Not on file   Highest education level: Not on file  Occupational History   Not on file  Tobacco Use   Smoking status: Former    Current packs/day: 0.00    Types: Cigarettes    Quit date: 05/19/2003    Years since quitting: 19.6   Smokeless tobacco: Never  Vaping Use   Vaping status: Never Used  Substance and Sexual Activity   Alcohol use: Yes    Alcohol/week: 4.0 standard drinks of alcohol    Types: 2 Glasses of wine, 2 Cans of beer per week   Drug  use: Never   Sexual activity: Yes    Birth control/protection: None  Other Topics Concern   Not on file  Social History Narrative   Not on file   Social Determinants of Health   Financial Resource Strain: Not on file  Food Insecurity: Not on file  Transportation Needs: Not on file  Physical Activity: Not on file  Stress: Not on file  Social Connections: Not on file  Intimate Partner Violence: Not on file    Review of Systems: See HPI, otherwise negative ROS  Physical Exam: BP 98/75   Pulse 83   Temp 98.6 F (37 C) (Temporal)   Resp 10   Ht 5\' 5"  (1.651 m)   Wt 96.3 kg   SpO2 96%   BMI 35.33 kg/m  General:   Alert,  pleasant and cooperative in NAD Head:  Normocephalic and atraumatic. Neck:  Supple; no masses or thyromegaly. Lungs:  Clear throughout to auscultation.    Heart:  Regular rate and rhythm. Abdomen:  Soft, nontender and nondistended. Normal bowel sounds, without guarding, and without rebound.   Neurologic:  Alert and  oriented x4;  grossly normal neurologically.  Impression/Plan: Candace Reed is now here to undergo a screening colonoscopy.  Risks, benefits, and alternatives regarding  colonoscopy have been reviewed with the patient.  Questions have been answered.  All parties agreeable.

## 2022-12-24 NOTE — Op Note (Addendum)
[facility] Gastroenterology Patient Name: Candace Reed Procedure Date: 12/24/2022 10:02 AM MRN: [id number] Account #: [id number] Date of Birth: 30-Jan-1976 Admit Type: Outpatient Age: 47 Room: [facility] OR ROOM 01 Gender: Female Note Status: Finalized Instrument Name: [id number] Procedure:             Colonoscopy Indications:           Screening for colorectal malignant neoplasm Providers:             Midge Minium MD, MD Referring MD:          Dorcas Carrow (Referring MD) Medicines:             Propofol per Anesthesia Complications:         No immediate complications. Procedure:             Pre-Anesthesia Assessment:                        - Prior to the procedure, a History and Physical was                         performed, and patient medications and allergies were                         reviewed. The patient's tolerance of previous                         anesthesia was also reviewed. The risks and benefits                         of the procedure and the sedation options and risks                         were discussed with the patient. All questions were                         answered, and informed consent was obtained. Prior                         Anticoagulants: The patient has taken no anticoagulant                         or antiplatelet agents. ASA Grade Assessment: II - A                         patient with mild systemic disease. After reviewing                         the risks and benefits, the patient was deemed in                         satisfactory condition to undergo the procedure.                        After obtaining informed consent, the colonoscope was                         passed under direct vision. Throughout the procedure,  the patient's blood pressure, pulse, and oxygen                         saturations were monitored continuously. The                         Colonoscope was introduced through the anus and                          advanced to the the cecum, identified by appendiceal                         orifice and ileocecal valve. The colonoscopy was                         performed without difficulty. The patient tolerated                         the procedure well. The quality of the bowel                         preparation was excellent. Findings:      The perianal and digital rectal examinations were normal.      Non-bleeding internal hemorrhoids were found during retroflexion. The       hemorrhoids were Grade I (internal hemorrhoids that do not prolapse). Impression:            - Non-bleeding internal hemorrhoids.                        - No specimens collected. Recommendation:        - Discharge patient to home.                        - Resume previous diet.                        - Continue present medications.                        - Repeat colonoscopy in 10 years for screening                         purposes. Procedure Code(s):     --- Professional ---                        [phone], Colonoscopy, flexible; diagnostic, including                         collection of specimen(s) by brushing or washing, when                         performed (separate procedure) Diagnosis Code(s):     --- Professional ---                        Z12.11, Encounter for screening for malignant neoplasm                         of colon CPT copyright 2022 American Medical Association. All rights reserved. The  codes documented in this report are preliminary and upon coder review may  be revised to meet current compliance requirements. Midge Minium MD, MD 12/24/2022 10:27:09 AM This report has been signed electronically. Number of Addenda: 0 Note Initiated On: 12/24/2022 10:02 AM Scope Withdrawal Time: 0 hours 7 minutes 54 seconds  Total Procedure Duration: 0 hours 12 minutes 15 seconds  Estimated Blood Loss:  Estimated blood loss: none.      [facility]

## 2023-06-11 DIAGNOSIS — J029 Acute pharyngitis, unspecified: Secondary | ICD-10-CM | POA: Diagnosis not present

## 2023-10-14 ENCOUNTER — Encounter: Payer: Self-pay | Admitting: Family Medicine

## 2023-10-20 NOTE — Congregational Nurse Program (Unsigned)
 Saw client at a [employer] event with [employer] and she wanted her blood pressure checked. Discussed hydration in the heat and nutrition.

## 2023-11-10 ENCOUNTER — Encounter: Payer: Self-pay | Admitting: Family Medicine

## 2023-12-15 ENCOUNTER — Encounter: Payer: Self-pay | Admitting: Family Medicine

## 2023-12-15 ENCOUNTER — Ambulatory Visit: Admitting: Family Medicine

## 2023-12-15 VITALS — BP 116/58 | HR 88 | Temp 98.0°F | Ht 63.5 in | Wt 210.4 lb

## 2023-12-15 DIAGNOSIS — R1902 Left upper quadrant abdominal swelling, mass and lump: Secondary | ICD-10-CM | POA: Diagnosis not present

## 2023-12-15 DIAGNOSIS — Z0000 Encounter for general adult medical examination without abnormal findings: Secondary | ICD-10-CM | POA: Diagnosis not present

## 2023-12-15 DIAGNOSIS — Z1231 Encounter for screening mammogram for malignant neoplasm of breast: Secondary | ICD-10-CM | POA: Diagnosis not present

## 2023-12-15 DIAGNOSIS — Z23 Encounter for immunization: Secondary | ICD-10-CM

## 2023-12-15 MED ORDER — FLUTICASONE PROPIONATE 50 MCG/ACT NA SUSP: 2.0000 | Freq: Two times a day (BID) | NASAL | 12 refills | Status: AC

## 2023-12-15 MED ORDER — MONTELUKAST SODIUM 10 MG PO TABS: ORAL_TABLET | ORAL | 3 refills | Status: AC

## 2023-12-15 MED ORDER — OMEPRAZOLE 20 MG PO CPDR: 20.0000 mg | DELAYED_RELEASE_CAPSULE | Freq: Every day | ORAL | 3 refills | Status: AC

## 2023-12-15 MED ORDER — IRON (FERROUS SULFATE) 325 (65 FE) MG PO TABS: 325.0000 mg | ORAL_TABLET | Freq: Every day | ORAL | 3 refills | Status: AC

## 2023-12-15 NOTE — Progress Notes (Signed)
 BP (!) 116/58   Pulse 88   Temp 98 F (36.7 C) (Oral)   Ht 5' 3.5 (1.613 m)   Wt 210 lb 6.4 oz (95.4 kg)   SpO2 98%   BMI 36.69 kg/m    Subjective:    Patient ID: Candace Reed, female    DOB: [birth year], 48 y.o.   MRN: [id number]  HPI: [name] is a 49 y.o. female presenting on 12/15/2023 for comprehensive medical examination. Current medical complaints include:  LUMP Duration: 2 months Location: abdominal above her belly button, maybe slightly to the L Onset: unsure Painful: no Discomfort: no Status:  not changing Trauma: no Redness: no Bruising: no Recent infection: no Swollen lymph nodes: no Requesting removal: no History of cancer: no Family history of cancer: no History of the same: no Associated signs and symptoms: none  She currently lives with: husband and sons Menopausal Symptoms: no  Depression Screen done today and results listed below:     12/15/2023   10:31 AM 11/09/2022    9:29 AM 08/28/2022    8:55 AM 08/28/2022    8:50 AM 10/30/2021    9:34 AM  Depression screen PHQ 2/9  Decreased Interest 0 0 0 0 0  Down, Depressed, Hopeless 0 0 0 0 0  PHQ - 2 Score 0 0 0 0 0  Altered sleeping 0 0 0 0 0  Tired, decreased energy 1 0 0 0 0  Change in appetite 1 0 0 0 2  Feeling bad or failure about yourself  0 0 0 0 0  Trouble concentrating 0 0 0 0 0  Moving slowly or fidgety/restless 0 0 0 0 0  Suicidal thoughts 0 0 0 0 0  PHQ-9 Score 2 0 0 0 2  Difficult doing work/chores Not difficult at all Not difficult at all  Not difficult at all Not difficult at all    Past Medical History:  Past Medical History:  Diagnosis Date   Allergy    GERD (gastroesophageal reflux disease)     Surgical History:  Past Surgical History:  Procedure Laterality Date   COLONOSCOPY WITH PROPOFOL  N/A 12/24/2022   Procedure: COLONOSCOPY WITH PROPOFOL ;  Surgeon: Jinny Carmine, MD;  Location: [facility];  Service: Endoscopy;  Laterality: N/A;    Medications:  No  current outpatient medications on file prior to visit.   No current facility-administered medications on file prior to visit.    Allergies:  No Known Allergies  Social History:  Social History   Socioeconomic History   Marital status: Married    Spouse name: Not on file   Number of children: Not on file   Years of education: Not on file   Highest education level: Not on file  Occupational History   Not on file  Tobacco Use   Smoking status: Former    Current packs/day: 0.00    Types: Cigarettes    Quit date: 05/19/2003    Years since quitting: 20.5   Smokeless tobacco: Never  Vaping Use   Vaping status: Never Used  Substance and Sexual Activity   Alcohol use: Yes    Alcohol/week: 4.0 standard drinks of alcohol    Types: 2 Glasses of wine, 2 Cans of beer per week   Drug use: Never   Sexual activity: Yes    Birth control/protection: None  Other Topics Concern   Not on file  Social History Narrative   Not on file   Social Drivers of Health  Financial Resource Strain: Not on file  Food Insecurity: No Food Insecurity (12/15/2023)   Hunger Vital Sign    Worried About Running Out of Food in the Last Year: Never true    Ran Out of Food in the Last Year: Never true  Transportation Needs: Not on file  Physical Activity: Insufficiently Active (12/15/2023)   Exercise Vital Sign    Days of Exercise per Week: 2 days    Minutes of Exercise per Session: 60 min  Stress: No Stress Concern Present (12/15/2023)   [employer] of Occupational Health - Occupational Stress Questionnaire    Feeling of Stress: Not at all  Social Connections: Socially Integrated (12/15/2023)   Social Connection and Isolation Panel    Frequency of Communication with Friends and Family: More than three times a week    Frequency of Social Gatherings with Friends and Family: Once a week    Attends Religious Services: More than 4 times per year    Active Member of [employer] or Organizations: Yes    Attends  Engineer, structural: More than 4 times per year    Marital Status: Married  Catering manager Violence: Not on file   Social History   Tobacco Use  Smoking Status Former   Current packs/day: 0.00   Types: Cigarettes   Quit date: 05/19/2003   Years since quitting: 20.5  Smokeless Tobacco Never   Social History   Substance and Sexual Activity  Alcohol Use Yes   Alcohol/week: 4.0 standard drinks of alcohol   Types: 2 Glasses of wine, 2 Cans of beer per week    Family History:  Family History  Problem Relation Age of Onset   Cancer Mother        Thyroid    Allergies Son    Birth defects Maternal Grandmother    Birth defects Maternal Grandfather    Breast cancer Neg Hx     Past medical history, surgical history, medications, allergies, family history and social history reviewed with patient today and changes made to appropriate areas of the chart.   Review of Systems  Constitutional: Negative.   HENT: Negative.    Eyes:  Positive for blurred vision. Negative for double vision, photophobia, pain, discharge and redness.  Respiratory: Negative.    Cardiovascular: Negative.   Gastrointestinal:  Positive for abdominal pain and heartburn (with food choices). Negative for blood in stool, constipation, diarrhea, melena, nausea and vomiting.  Skin: Negative.   Neurological: Negative.   Endo/Heme/Allergies: Negative.   Psychiatric/Behavioral: Negative.     All other ROS negative except what is listed above and in the HPI.      Objective:    BP (!) 116/58   Pulse 88   Temp 98 F (36.7 C) (Oral)   Ht 5' 3.5 (1.613 m)   Wt 210 lb 6.4 oz (95.4 kg)   SpO2 98%   BMI 36.69 kg/m   Wt Readings from Last 3 Encounters:  12/15/23 210 lb 6.4 oz (95.4 kg)  12/24/22 212 lb 4.8 oz (96.3 kg)  11/09/22 219 lb 9.6 oz (99.6 kg)    Physical Exam Vitals and nursing note reviewed.  Constitutional:      General: She is not in acute distress.    Appearance: Normal appearance. She  is not ill-appearing, toxic-appearing or diaphoretic.  HENT:     Head: Normocephalic and atraumatic.     Right Ear: Tympanic membrane, ear canal and external ear normal. There is no impacted cerumen.  Left Ear: Tympanic membrane, ear canal and external ear normal. There is no impacted cerumen.     Nose: Nose normal. No congestion or rhinorrhea.     Mouth/Throat:     Mouth: Mucous membranes are moist.     Pharynx: Oropharynx is clear. No oropharyngeal exudate or posterior oropharyngeal erythema.  Eyes:     General: No scleral icterus.       Right eye: No discharge.        Left eye: No discharge.     Extraocular Movements: Extraocular movements intact.     Conjunctiva/sclera: Conjunctivae normal.     Pupils: Pupils are equal, round, and reactive to light.  Neck:     Vascular: No carotid bruit.  Cardiovascular:     Rate and Rhythm: Normal rate and regular rhythm.     Pulses: Normal pulses.     Heart sounds: No murmur heard.    No friction rub. No gallop.  Pulmonary:     Effort: Pulmonary effort is normal. No respiratory distress.     Breath sounds: Normal breath sounds. No stridor. No wheezing, rhonchi or rales.  Chest:     Chest wall: No tenderness.  Abdominal:     General: Abdomen is flat. Bowel sounds are normal. There is no distension.     Palpations: Abdomen is soft. There is no mass.     Tenderness: There is no abdominal tenderness. There is no right CVA tenderness, left CVA tenderness, guarding or rebound.     Hernia: No hernia is present.   Genitourinary:    Comments: Breast and pelvic exams deferred with shared decision making Musculoskeletal:        General: No swelling, tenderness, deformity or signs of injury.     Cervical back: Normal range of motion and neck supple. No rigidity. No muscular tenderness.     Right lower leg: No edema.     Left lower leg: No edema.  Lymphadenopathy:     Cervical: No cervical adenopathy.  Skin:    General: Skin is warm and dry.      Capillary Refill: Capillary refill takes less than 2 seconds.     Coloration: Skin is not jaundiced or pale.     Findings: No bruising, erythema, lesion or rash.  Neurological:     General: No focal deficit present.     Mental Status: She is alert and oriented to person, place, and time. Mental status is at baseline.     Cranial Nerves: No cranial nerve deficit.     Sensory: No sensory deficit.     Motor: No weakness.     Coordination: Coordination normal.     Gait: Gait normal.     Deep Tendon Reflexes: Reflexes normal.  Psychiatric:        Mood and Affect: Mood normal.        Behavior: Behavior normal.        Thought Content: Thought content normal.        Judgment: Judgment normal.     Results for orders placed or performed during the hospital encounter of 12/24/22  Pregnancy, urine POC   Collection Time: 12/24/22  9:05 AM  Result Value Ref Range   Preg Test, Ur NEGATIVE NEGATIVE      Assessment & Plan:   Problem List Items Addressed This Visit   None Visit Diagnoses       Routine general medical examination at a health care facility    -  Primary  Vaccines up to date. Screening labs checked today. Mammogram ordered. Colonoscopy up to date. Continue diet and exercise. Call with any concerns.   Relevant Orders   CBC with Differential/Platelet   Comprehensive metabolic panel with GFR   Lipid Panel w/o Chol/HDL Ratio   TSH   Hepatitis B surface antibody,quantitative     Left upper quadrant abdominal mass       Smooth. ?hypertrophic muscle or diaphragm prominence. Will check US . Ordered today. Await results.   Relevant Orders   US  Abdomen Complete     Encounter for screening mammogram for malignant neoplasm of breast       Mammogram ordered today.   Relevant Orders   MM 3D SCREENING MAMMOGRAM BILATERAL BREAST        Follow up plan: Return in about 1 year (around 12/14/2024) for physical.   LABORATORY TESTING:  - Pap smear: up to date  IMMUNIZATIONS:    - Tdap: Tetanus vaccination status reviewed: Tdap vaccination indicated and given today. - Influenza: Postponed to flu season - Pneumovax: Not applicable - Prevnar: Not applicable - COVID: Refused - HPV: Not applicable - Shingrix vaccine: Not applicable  SCREENING: -Mammogram: Ordered today  - Colonoscopy: Up to date   PATIENT COUNSELING:   Advised to take 1 mg of folate supplement per day if capable of pregnancy.   Sexuality: Discussed sexually transmitted diseases, partner selection, use of condoms, avoidance of unintended pregnancy  and contraceptive alternatives.   Advised to avoid cigarette smoking.  I discussed with the patient that most people either abstain from alcohol or drink within safe limits (<=14/week and <=4 drinks/occasion for males, <=7/weeks and <= 3 drinks/occasion for females) and that the risk for alcohol disorders and other health effects rises proportionally with the number of drinks per week and how often a drinker exceeds daily limits.  Discussed cessation/primary prevention of drug use and availability of treatment for abuse.   Diet: Encouraged to adjust caloric intake to maintain  or achieve ideal body weight, to reduce intake of dietary saturated fat and total fat, to limit sodium intake by avoiding high sodium foods and not adding table salt, and to maintain adequate dietary potassium and calcium preferably from fresh fruits, vegetables, and low-fat dairy products.    stressed the importance of regular exercise  Injury prevention: Discussed safety belts, safety helmets, smoke detector, smoking near bedding or upholstery.   Dental health: Discussed importance of regular tooth brushing, flossing, and dental visits.    NEXT PREVENTATIVE PHYSICAL DUE IN 1 YEAR. Return in about 1 year (around 12/14/2024) for physical.

## 2023-12-15 NOTE — Patient Instructions (Signed)
 Please call to schedule your mammogram and/or bone density: [facility]  Address: [address], [place], [place] [address] Phone: [phone]  [facility] [address] [place],  [place]  [address] Phone: [phone]

## 2023-12-16 ENCOUNTER — Ambulatory Visit: Payer: Self-pay | Admitting: Family Medicine

## 2023-12-16 DIAGNOSIS — K460 Unspecified abdominal hernia with obstruction, without gangrene: Secondary | ICD-10-CM

## 2023-12-16 LAB — COMPREHENSIVE METABOLIC PANEL WITH GFR
ALT: 15 IU/L (ref 0–32)
AST: 15 IU/L (ref 0–40)
Albumin: 4.1 g/dL (ref 3.9–4.9)
Alkaline Phosphatase: 60 IU/L (ref 44–121)
BUN/Creatinine Ratio: 11 (ref 9–23)
BUN: 10 mg/dL (ref 6–24)
Bilirubin Total: 0.3 mg/dL (ref 0.0–1.2)
CO2: 23 mmol/L (ref 20–29)
Calcium: 9.5 mg/dL (ref 8.7–10.2)
Chloride: 101 mmol/L (ref 96–106)
Creatinine, Ser: 0.9 mg/dL (ref 0.57–1.00)
Globulin, Total: 2.5 g/dL (ref 1.5–4.5)
Glucose: 102 mg/dL — ABNORMAL HIGH (ref 70–99)
Potassium: 4.3 mmol/L (ref 3.5–5.2)
Sodium: 138 mmol/L (ref 134–144)
Total Protein: 6.6 g/dL (ref 6.0–8.5)
eGFR: 80 mL/min/1.73 (ref >59)

## 2023-12-16 LAB — CBC WITH DIFFERENTIAL/PLATELET
Basophils Absolute: 0 x10E3/uL (ref 0.0–0.2)
Basos: 1 %
EOS (ABSOLUTE): 0.1 x10E3/uL (ref 0.0–0.4)
Eos: 2 %
Hematocrit: 37.8 % (ref 34.0–46.6)
Hemoglobin: 11.7 g/dL (ref 11.1–15.9)
Immature Grans (Abs): 0 x10E3/uL (ref 0.0–0.1)
Immature Granulocytes: 0 %
Lymphocytes Absolute: 1.7 x10E3/uL (ref 0.7–3.1)
Lymphs: 28 %
MCH: 27.3 pg (ref 26.6–33.0)
MCHC: 31 g/dL — ABNORMAL LOW (ref 31.5–35.7)
MCV: 88 fL (ref 79–97)
Monocytes Absolute: 0.5 x10E3/uL (ref 0.1–0.9)
Monocytes: 9 %
Neutrophils Absolute: 3.6 x10E3/uL (ref 1.4–7.0)
Neutrophils: 60 %
Platelets: 376 x10E3/uL (ref 150–450)
RBC: 4.29 x10E6/uL (ref 3.77–5.28)
RDW: 15 % (ref 11.7–15.4)
WBC: 5.9 x10E3/uL (ref 3.4–10.8)

## 2023-12-16 LAB — LIPID PANEL W/O CHOL/HDL RATIO
Cholesterol, Total: 163 mg/dL (ref 100–199)
HDL: 64 mg/dL (ref >39)
LDL Chol Calc (NIH): 77 mg/dL (ref 0–99)
Triglycerides: 123 mg/dL (ref 0–149)
VLDL Cholesterol Cal: 22 mg/dL (ref 5–40)

## 2023-12-16 LAB — HEPATITIS B SURFACE ANTIBODY, QUANTITATIVE: Hepatitis B Surf Ab Quant: <3.5 m[IU]/mL — ABNORMAL LOW

## 2023-12-16 LAB — TSH: TSH: 1.45 u[IU]/mL (ref 0.450–4.500)

## 2023-12-21 ENCOUNTER — Ambulatory Visit
Admission: RE | Admit: 2023-12-21 | Discharge: 2023-12-21 | Disposition: A | Discharge status: Home / Self Care | Source: Ambulatory Visit | Attending: Family Medicine | Admitting: Family Medicine

## 2023-12-21 ENCOUNTER — Other Ambulatory Visit: Payer: Self-pay | Admitting: Family Medicine

## 2023-12-21 DIAGNOSIS — R1905 Periumbilic swelling, mass or lump: Secondary | ICD-10-CM | POA: Diagnosis not present

## 2023-12-21 DIAGNOSIS — R160 Hepatomegaly, not elsewhere classified: Secondary | ICD-10-CM | POA: Diagnosis not present

## 2023-12-21 DIAGNOSIS — R1902 Left upper quadrant abdominal swelling, mass and lump: Secondary | ICD-10-CM

## 2023-12-21 DIAGNOSIS — K769 Liver disease, unspecified: Secondary | ICD-10-CM | POA: Diagnosis not present

## 2023-12-24 ENCOUNTER — Ambulatory Visit: Payer: Self-pay | Admitting: Family Medicine

## 2023-12-24 NOTE — Telephone Encounter (Signed)
 FYI  Copied from CRM [id number]. Topic: Clinical - Lab/Test Results >> Dec 24, 2023  8:12 AM Montie POUR wrote: Reason for CRM:  [facility] called to let Dr. Vicci know the results are in for Geraline's ultrasound. Please review in her chart. Thanks

## 2023-12-27 ENCOUNTER — Other Ambulatory Visit: Payer: Self-pay | Admitting: Family Medicine

## 2023-12-27 ENCOUNTER — Encounter: Payer: Self-pay | Admitting: Family Medicine

## 2023-12-27 DIAGNOSIS — R160 Hepatomegaly, not elsewhere classified: Secondary | ICD-10-CM

## 2023-12-27 DIAGNOSIS — R935 Abnormal findings on diagnostic imaging of other abdominal regions, including retroperitoneum: Secondary | ICD-10-CM

## 2024-01-05 ENCOUNTER — Telehealth: Payer: Self-pay | Admitting: Family Medicine

## 2024-01-05 NOTE — Telephone Encounter (Signed)
 Candace [facility] from [facility] is needing authorization for radiology.  Please call Candace Reed at [phone] Ext [phone]

## 2024-01-06 ENCOUNTER — Ambulatory Visit: Payer: Self-pay | Admitting: General Surgery

## 2024-01-06 ENCOUNTER — Encounter: Payer: Self-pay | Admitting: General Surgery

## 2024-01-06 VITALS — BP 110/75 | HR 66 | Ht 63.5 in | Wt 209.0 lb

## 2024-01-06 DIAGNOSIS — K439 Ventral hernia without obstruction or gangrene: Secondary | ICD-10-CM | POA: Diagnosis not present

## 2024-01-06 NOTE — Progress Notes (Signed)
 Patient ID: Candace Reed, female   DOB: 04/19/1976, 48 y.o.   MRN: [id number] CC: Abdominal wall Hernia History of Present Illness Candace Reed is a 48 y.o. female with past medical history significant for GERD who presents in consultation for abdominal wall hernia.  The patient reports that over the last several months she has noticed a bulge in her abdomen.  She says that this is worse with standing up.  She denies any pain with this.  She reports that when she lays down the bulge does seem to get smaller.  She denies any overlying skin changes or symptoms of obstipation.  She did have an ultrasound which I personally reviewed that showed an abdominal wall hernia.  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  Past Medical History Past Medical History:  Diagnosis Date   Allergy    GERD (gastroesophageal reflux disease)        Past Surgical History:  Procedure Laterality Date   COLONOSCOPY WITH PROPOFOL  N/A 12/24/2022   Procedure: COLONOSCOPY WITH PROPOFOL ;  Surgeon: Jinny Carmine, MD;  Location: [facility];  Service: Endoscopy;  Laterality: N/A;    No Known Allergies  Current Outpatient Medications  Medication Sig Dispense Refill   fluticasone  (FLONASE ) 50 MCG/ACT nasal spray Place 2 sprays into both nostrils 2 (two) times daily. 16 g 12   Iron , Ferrous Sulfate , 325 (65 Fe) MG TABS Take 325 mg by mouth daily. 90 tablet 3   montelukast  (SINGULAIR ) 10 MG tablet TAKE 1 TABLET BY MOUTH EVERYDAY AT BEDTIME 90 tablet 3   omeprazole  (PRILOSEC) 20 MG capsule Take 1 capsule (20 mg total) by mouth daily. 90 capsule 3   No current facility-administered medications for this visit.    Family History Family History  Problem Relation Age of Onset   Cancer Mother        Thyroid    Allergies Son    Birth defects Maternal Grandmother    Birth defects Maternal Grandfather    Breast cancer Neg Hx        Social History Social  History   Tobacco Use   Smoking status: Former    Current packs/day: 0.00    Types: Cigarettes    Quit date: 05/19/2003    Years since quitting: 20.6   Smokeless tobacco: Never  Vaping Use   Vaping status: Never Used  Substance Use Topics   Alcohol use: Yes    Alcohol/week: 4.0 standard drinks of alcohol    Types: 2 Glasses of wine, 2 Cans of beer per week   Drug use: Never        ROS Full ROS of systems performed and is otherwise negative there than what is stated in the HPI  Physical Exam Blood pressure 110/75, pulse 66, height 5' 3.5 (1.613 m), weight 209 lb (94.8 kg), SpO2 96%.  Alert and oriented x 3, normal work of breathing on room air, regular rate and rhythm, abdomen soft, some tenderness right over the area of hernia with palpation, there is an umbilical hernia defect that is approximately 2 cm, it seems to contain fat in it but is easily reducible.  There are no overlying skin changes.  Data Reviewed I reviewed her ultrasound and there was fat within the hernia as well as bowel and the hernia defect measures approximately 1.4 cm.  I have personally reviewed the patient's imaging and medical records.    Assessment    Patient with  abdominal wall hernia.  This does not seem to bother her at this time.  I discussed with her that if she is not having any symptoms from it I would not pursue any operative intervention given her weight and her lifestyle of heavy lifting.  We will plan to see her again in 6 months to see if she is developed any worsening symptoms from her hernia.  I discussed hernia precautions including overlying skin changes, irreducibility of the hernia, obstipation and nausea vomiting.  She is also obese so I recommended that she try to lose as much as weight as this may help with the risk of recurrence if she does undergo hernia surgery.  A total of 45 minutes was spent reviewing the patient's chart, performing history and physical and discussing treatment  options with the patient     Jayson MALVA Endow

## 2024-01-06 NOTE — Patient Instructions (Signed)
 We will see you in January for a follow up- Our schedule is not available yet, so we placed you in our recall system and will send you out a letter with the appointment information   In the mean time if you have any questions or concerns please give our office a call   Hernia, Adult     A hernia happens when an organ or tissue inside your body pushes out through a weak spot in the muscles of your belly. This makes a bulge. The bulge may be: In a scar from surgery. This type of bulge is called an incisional hernia. Near your belly button. This type is called an umbilical hernia. In your groin, which is the area where your leg meets your lower belly. This type is called an inguinal hernia. If you're female, this type could also be in your scrotum. In your upper thigh. This type is called a femoral hernia. Inside your belly. This type is called a hiatal hernia. It happens when your stomach slides above your diaphragm, which is the muscle between your belly and your chest. What are the causes? You may get a hernia if: You lift heavy things. You cough over a long period of time. You have trouble pooping, also called constipation. Trouble pooping can lead to straining. There's a cut from surgery in your belly. You have a problem that's present at birth. There's fluid around your belly. You're female and have a testicle that hasn't moved down into your scrotum. You may be at greater risk for hernia if: You smoke. You're very overweight. What are the signs or symptoms? The main symptom is a bulge, but the bulge may not always be seen. It may grow bigger or be easier to see when you cough or strain, such as when you lift something heavy. If you can push the bulge back into your belly, it may not cause pain. If you can't push it back into your belly, it may lose its blood supply. This can cause: Pain. Fever. Nausea and vomiting. Swelling. Trouble pooping. How is this diagnosed? A hernia may be  diagnosed based on your symptoms, medical history, and an exam. Your health care provider may ask you to cough or move in a way that makes the bulge easier to see. You may also have tests done. These may include: X-rays. Ultrasound. CT scan. How is this treated? A hernia that's small and painless may not need to be treated. A hernia that's large or painful may be treated with surgery. Surgery involves pushing the bulge back into place and fixing the weak area of the muscle or belly. Follow these instructions at home: Activity Try not to strain. You may have to avoid lifting. Ask how much weight you can safely lift. If you lift something heavy, use your leg muscles. Do not use your back muscles to lift. Prevent trouble pooping You may need to take these actions to prevent or treat trouble pooping: Drink enough fluid to keep your pee (urine) pale yellow. Take medicines to help you poop. Eat foods high in fiber. These include beans, whole grains, and fresh fruits and vegetables. General instructions When you cough, try to cough gently. Try to push the bulge back in by very gently pressing on it when you're lying down. Do not try to force it back in if it won't push in easily. If you're overweight, work with your provider to lose weight safely. Do not smoke, vape, or use products with  nicotine or tobacco in them. If you need help quitting, talk with your provider. If you're going to have surgery, watch your hernia for changes in shape, size, or color. Tell your provider about any changes. Contact a health care provider if: You get new pain, swelling, or redness near your hernia. You have trouble pooping. Your poop is harder or larger than normal. You have a fever or chills. You have nausea or vomiting. Your hernia can't be pushed in. Get help right away if: You have belly pain that gets worse. Your hernia: Changes in shape or size. Changes color. Feels hard or hurts when you touch  it. These symptoms may be an emergency. Call 911 right away. Do not wait to see if the symptoms will go away. Do not drive yourself to the hospital. This information is not intended to replace advice given to you by your health care provider. Make sure you discuss any questions you have with your health care provider. Document Revised: 02/04/2023 Document Reviewed: 07/28/2022 Elsevier Patient Education  2024 [employer].

## 2024-01-07 ENCOUNTER — Ambulatory Visit

## 2024-01-27 ENCOUNTER — Telehealth: Payer: Self-pay

## 2024-01-27 NOTE — Telephone Encounter (Signed)
 Copied from CRM [id number]. Topic: Referral - Question >> Jan 27, 2024 10:12 AM Carlatta H wrote: Reason for CRM: Patients referral needs the facility name to be updated since the place she will be completing her imaging was updated//the patient would like a call once done

## 2024-01-28 NOTE — Telephone Encounter (Signed)
 Forwarding to referral team for assistance updating.

## 2024-04-11 ENCOUNTER — Encounter: Payer: Self-pay | Admitting: Family Medicine

## 2024-04-26 ENCOUNTER — Inpatient Hospital Stay
Admission: RE | Admit: 2024-04-26 | Discharge: 2024-04-26 | Disposition: A | Source: Ambulatory Visit | Attending: Family Medicine

## 2024-04-26 DIAGNOSIS — R160 Hepatomegaly, not elsewhere classified: Secondary | ICD-10-CM

## 2024-04-26 DIAGNOSIS — R935 Abnormal findings on diagnostic imaging of other abdominal regions, including retroperitoneum: Secondary | ICD-10-CM

## 2024-04-26 MED ORDER — GADOPICLENOL 0.5 MMOL/ML IV SOLN
10.0000 mL | Freq: Once | INTRAVENOUS | Status: AC | PRN
  Administered 2024-04-26: 10 mL via INTRAVENOUS

## 2024-04-27 ENCOUNTER — Ambulatory Visit: Payer: Self-pay | Admitting: Family Medicine

## 2024-05-23 ENCOUNTER — Ambulatory Visit: Admitting: General Surgery

## 2024-12-18 ENCOUNTER — Encounter: Admitting: Family Medicine
